# Patient Record
Sex: Male | Born: 1955
Health system: Southern US, Community
[De-identification: ages and names within clinical notes are randomized; demographics above are authoritative.]

## PROBLEM LIST (undated history)

## (undated) DIAGNOSIS — F329 Major depressive disorder, single episode, unspecified: Secondary | ICD-10-CM

## (undated) DIAGNOSIS — K219 Gastro-esophageal reflux disease without esophagitis: Secondary | ICD-10-CM

## (undated) DIAGNOSIS — E785 Hyperlipidemia, unspecified: Secondary | ICD-10-CM

## (undated) DIAGNOSIS — C801 Malignant (primary) neoplasm, unspecified: Secondary | ICD-10-CM

## (undated) DIAGNOSIS — F32A Depression, unspecified: Secondary | ICD-10-CM

## (undated) HISTORY — DX: Major depressive disorder, single episode, unspecified: F32.9

## (undated) HISTORY — PX: ORCHIECTOMY: SHX2116

## (undated) HISTORY — DX: Gastro-esophageal reflux disease without esophagitis: K21.9

## (undated) HISTORY — PX: COLONOSCOPY: SHX174

## (undated) HISTORY — DX: Malignant (primary) neoplasm, unspecified: C80.1

## (undated) HISTORY — PX: INGUINAL HERNIA REPAIR: SUR1180

## (undated) HISTORY — DX: Depression, unspecified: F32.A

## (undated) HISTORY — DX: Hyperlipidemia, unspecified: E78.5

---

## 1999-01-25 DIAGNOSIS — C801 Malignant (primary) neoplasm, unspecified: Secondary | ICD-10-CM

## 1999-01-25 HISTORY — DX: Malignant (primary) neoplasm, unspecified: C80.1

## 2004-10-06 ENCOUNTER — Ambulatory Visit: Payer: Self-pay | Admitting: Internal Medicine

## 2005-02-18 ENCOUNTER — Ambulatory Visit: Payer: Self-pay | Admitting: Internal Medicine

## 2005-03-14 ENCOUNTER — Ambulatory Visit: Payer: Self-pay | Admitting: Internal Medicine

## 2005-07-13 ENCOUNTER — Ambulatory Visit: Payer: Self-pay | Admitting: Internal Medicine

## 2005-07-25 ENCOUNTER — Ambulatory Visit: Payer: Self-pay | Admitting: Internal Medicine

## 2005-11-16 ENCOUNTER — Ambulatory Visit: Payer: Self-pay | Admitting: Internal Medicine

## 2005-11-16 LAB — CONVERTED CEMR LAB
ALT: 37 units/L (ref 0–40)
AST: 26 units/L (ref 0–37)
Albumin: 4.3 g/dL (ref 3.5–5.2)
BUN: 14 mg/dL (ref 6–23)
Basophils Relative: 0.3 % (ref 0.0–1.0)
Chol/HDL Ratio, serum: 5
Cholesterol: 141 mg/dL (ref 0–200)
Eosinophil percent: 2 % (ref 0.0–5.0)
GFR calc non Af Amer: 68 mL/min
HCT: 45.7 % (ref 39.0–52.0)
Hemoglobin: 14.9 g/dL (ref 13.0–17.0)
Leukocytes, UA: NEGATIVE
Neutro Abs: 3 10*3/uL (ref 1.4–7.7)
Neutrophils Relative %: 67.5 % (ref 43.0–77.0)
Nitrite: NEGATIVE
PSA: 0.95 ng/mL (ref 0.10–4.00)
Potassium: 4.7 meq/L (ref 3.5–5.1)
RBC: 5.32 M/uL (ref 4.22–5.81)
RDW: 12.7 % (ref 11.5–14.6)
Sodium: 139 meq/L (ref 135–145)
Specific Gravity, Urine: 1.02 (ref 1.000–1.03)
Total Bilirubin: 0.7 mg/dL (ref 0.3–1.2)
Total Protein, Urine: NEGATIVE mg/dL
Total Protein: 7.4 g/dL (ref 6.0–8.3)
Triglyceride fasting, serum: 132 mg/dL (ref 0–149)
Urine Glucose: NEGATIVE mg/dL
WBC: 4.5 10*3/uL (ref 4.5–10.5)

## 2005-11-22 ENCOUNTER — Ambulatory Visit: Payer: Self-pay | Admitting: Internal Medicine

## 2005-12-22 ENCOUNTER — Ambulatory Visit: Payer: Self-pay | Admitting: Gastroenterology

## 2006-01-03 ENCOUNTER — Ambulatory Visit: Payer: Self-pay | Admitting: Gastroenterology

## 2006-02-23 ENCOUNTER — Ambulatory Visit: Payer: Self-pay | Admitting: Internal Medicine

## 2006-09-27 ENCOUNTER — Ambulatory Visit: Payer: Self-pay | Admitting: Internal Medicine

## 2006-10-09 ENCOUNTER — Ambulatory Visit: Payer: Self-pay

## 2006-10-25 ENCOUNTER — Ambulatory Visit: Payer: Self-pay | Admitting: Internal Medicine

## 2006-10-25 LAB — CONVERTED CEMR LAB
ALT: 36 U/L
AST: 26 U/L
Albumin: 4.3 g/dL
Alkaline Phosphatase: 65 U/L
BUN: 22 mg/dL
Basophils Absolute: 0 K/uL
Basophils Relative: 0.2 %
Bilirubin, Direct: 0.2 mg/dL
CO2: 28 meq/L
Calcium: 9.8 mg/dL
Chloride: 110 meq/L
Cholesterol: 158 mg/dL
Creatinine, Ser: 1.2 mg/dL
Eosinophils Absolute: 0.1 K/uL
Eosinophils Relative: 2.3 %
GFR calc Af Amer: 81 mL/min
GFR calc non Af Amer: 67 mL/min
Glucose, Bld: 138 mg/dL — ABNORMAL HIGH
HCT: 42.5 %
HDL: 26.9 mg/dL — ABNORMAL LOW
Hemoglobin: 14.9 g/dL
LDL Cholesterol: 103 mg/dL — ABNORMAL HIGH
Lymphocytes Relative: 18.4 %
MCHC: 35 g/dL
MCV: 84.4 fL
Monocytes Absolute: 0.6 K/uL
Monocytes Relative: 14.2 % — ABNORMAL HIGH
Neutro Abs: 2.9 K/uL
Neutrophils Relative %: 64.9 %
Platelets: 157 K/uL
Potassium: 5.6 meq/L — ABNORMAL HIGH
RBC: 5.04 M/uL
RDW: 12.6 %
Sodium: 148 meq/L — ABNORMAL HIGH
TSH: 2.52 u[IU]/mL
Testosterone: 419.97 ng/dL
Total Bilirubin: 0.8 mg/dL
Total CHOL/HDL Ratio: 5.9
Total Protein: 7.2 g/dL
Triglycerides: 141 mg/dL
VLDL: 28 mg/dL
WBC: 4.4 10*3/microliter — ABNORMAL LOW

## 2006-10-30 ENCOUNTER — Encounter: Payer: Self-pay | Admitting: Internal Medicine

## 2006-10-30 DIAGNOSIS — F32A Depression, unspecified: Secondary | ICD-10-CM | POA: Insufficient documentation

## 2006-10-30 DIAGNOSIS — N529 Male erectile dysfunction, unspecified: Secondary | ICD-10-CM | POA: Insufficient documentation

## 2006-10-30 DIAGNOSIS — C6212 Malignant neoplasm of descended left testis: Secondary | ICD-10-CM | POA: Insufficient documentation

## 2006-10-30 DIAGNOSIS — F329 Major depressive disorder, single episode, unspecified: Secondary | ICD-10-CM

## 2006-10-30 DIAGNOSIS — E291 Testicular hypofunction: Secondary | ICD-10-CM | POA: Insufficient documentation

## 2006-10-30 DIAGNOSIS — K219 Gastro-esophageal reflux disease without esophagitis: Secondary | ICD-10-CM | POA: Insufficient documentation

## 2006-11-13 ENCOUNTER — Ambulatory Visit: Payer: Self-pay | Admitting: Internal Medicine

## 2006-11-13 LAB — CONVERTED CEMR LAB
BUN: 16 mg/dL (ref 6–23)
Calcium: 9.3 mg/dL (ref 8.4–10.5)
GFR calc Af Amer: 101 mL/min
GFR calc non Af Amer: 83 mL/min

## 2007-01-29 ENCOUNTER — Telehealth: Payer: Self-pay | Admitting: Internal Medicine

## 2007-02-16 ENCOUNTER — Encounter: Payer: Self-pay | Admitting: Internal Medicine

## 2007-04-10 ENCOUNTER — Telehealth: Payer: Self-pay | Admitting: Internal Medicine

## 2007-10-03 ENCOUNTER — Ambulatory Visit: Payer: Self-pay | Admitting: Internal Medicine

## 2007-10-03 LAB — CONVERTED CEMR LAB
Albumin: 4.2 g/dL (ref 3.5–5.2)
BUN: 19 mg/dL (ref 6–23)
Bilirubin Urine: NEGATIVE
Bilirubin, Direct: 0.1 mg/dL (ref 0.0–0.3)
Calcium: 9.2 mg/dL (ref 8.4–10.5)
Cholesterol: 135 mg/dL (ref 0–200)
GFR calc Af Amer: 100 mL/min
Glucose, Bld: 124 mg/dL — ABNORMAL HIGH (ref 70–99)
HCT: 43.4 % (ref 39.0–52.0)
HDL: 30.9 mg/dL — ABNORMAL LOW (ref >39.0)
Hemoglobin: 15 g/dL (ref 13.0–17.0)
Ketones, ur: NEGATIVE mg/dL
MCHC: 34.5 g/dL (ref 30.0–36.0)
Monocytes Absolute: 0.5 10*3/uL (ref 0.1–1.0)
Monocytes Relative: 13.3 % — ABNORMAL HIGH (ref 3.0–12.0)
Neutro Abs: 2.4 10*3/uL (ref 1.4–7.7)
Nitrite: NEGATIVE
PSA: 0.57 ng/mL (ref 0.10–4.00)
RDW: 12.8 % (ref 11.5–14.6)
Sodium: 140 meq/L (ref 135–145)
TSH: 2.52 microintl units/mL (ref 0.35–5.50)
Total Protein, Urine: NEGATIVE mg/dL
Total Protein: 7.3 g/dL (ref 6.0–8.3)
Triglycerides: 142 mg/dL (ref 0–149)
Urine Glucose: NEGATIVE mg/dL
pH: 7.5 (ref 5.0–8.0)

## 2007-10-10 ENCOUNTER — Ambulatory Visit: Payer: Self-pay | Admitting: Internal Medicine

## 2007-10-10 DIAGNOSIS — E785 Hyperlipidemia, unspecified: Secondary | ICD-10-CM | POA: Insufficient documentation

## 2007-10-16 ENCOUNTER — Telehealth: Payer: Self-pay | Admitting: Internal Medicine

## 2008-03-21 ENCOUNTER — Encounter: Payer: Self-pay | Admitting: Internal Medicine

## 2008-10-02 ENCOUNTER — Ambulatory Visit: Payer: Self-pay | Admitting: Internal Medicine

## 2008-10-02 LAB — CONVERTED CEMR LAB
ALT: 35 units/L (ref 0–53)
Albumin: 4.3 g/dL (ref 3.5–5.2)
Basophils Absolute: 0 10*3/uL (ref 0.0–0.1)
Calcium: 9.2 mg/dL (ref 8.4–10.5)
Cholesterol: 153 mg/dL (ref 0–200)
GFR calc non Af Amer: 73.8 mL/min (ref >60)
HCT: 43.4 % (ref 39.0–52.0)
HDL: 32.4 mg/dL — ABNORMAL LOW (ref >39.00)
Hemoglobin, Urine: NEGATIVE
Ketones, ur: NEGATIVE mg/dL
Leukocytes, UA: NEGATIVE
Lymphs Abs: 1 10*3/uL (ref 0.7–4.0)
MCV: 86.3 fL (ref 78.0–100.0)
Monocytes Absolute: 0.6 10*3/uL (ref 0.1–1.0)
Neutro Abs: 2.5 10*3/uL (ref 1.4–7.7)
PSA: 0.55 ng/mL (ref 0.10–4.00)
Platelets: 158 10*3/uL (ref 150.0–400.0)
RDW: 12.6 % (ref 11.5–14.6)
Sodium: 136 meq/L (ref 135–145)
Specific Gravity, Urine: 1.015 (ref 1.000–1.030)
TSH: 2.71 microintl units/mL (ref 0.35–5.50)
Total Bilirubin: 1.1 mg/dL (ref 0.3–1.2)
Triglycerides: 150 mg/dL — ABNORMAL HIGH (ref 0.0–149.0)
Urobilinogen, UA: 0.2 (ref 0.0–1.0)

## 2008-10-13 ENCOUNTER — Ambulatory Visit: Payer: Self-pay | Admitting: Internal Medicine

## 2008-10-13 DIAGNOSIS — R7309 Other abnormal glucose: Secondary | ICD-10-CM | POA: Insufficient documentation

## 2008-11-19 ENCOUNTER — Telehealth: Payer: Self-pay | Admitting: Internal Medicine

## 2008-11-26 ENCOUNTER — Telehealth: Payer: Self-pay | Admitting: Internal Medicine

## 2009-03-27 ENCOUNTER — Encounter: Payer: Self-pay | Admitting: Internal Medicine

## 2009-06-24 ENCOUNTER — Telehealth: Payer: Self-pay | Admitting: Internal Medicine

## 2009-06-29 ENCOUNTER — Encounter: Payer: Self-pay | Admitting: Internal Medicine

## 2009-07-10 ENCOUNTER — Telehealth: Payer: Self-pay | Admitting: Internal Medicine

## 2009-10-07 ENCOUNTER — Ambulatory Visit: Payer: Self-pay | Admitting: Internal Medicine

## 2009-10-07 LAB — CONVERTED CEMR LAB
ALT: 43 units/L (ref 0–53)
AST: 32 units/L (ref 0–37)
Alkaline Phosphatase: 58 units/L (ref 39–117)
BUN: 26 mg/dL — ABNORMAL HIGH (ref 6–23)
Basophils Absolute: 0 10*3/uL (ref 0.0–0.1)
Bilirubin Urine: NEGATIVE
Bilirubin, Direct: 0.1 mg/dL (ref 0.0–0.3)
Calcium: 9 mg/dL (ref 8.4–10.5)
Creatinine, Ser: 0.8 mg/dL (ref 0.4–1.5)
Eosinophils Relative: 1.8 % (ref 0.0–5.0)
GFR calc non Af Amer: 101.76 mL/min (ref >60)
HCT: 43.5 % (ref 39.0–52.0)
HDL: 26.9 mg/dL — ABNORMAL LOW (ref >39.00)
Hemoglobin, Urine: NEGATIVE
LDL Cholesterol: 90 mg/dL (ref 0–99)
Lymphocytes Relative: 20 % (ref 12.0–46.0)
Monocytes Relative: 10.9 % (ref 3.0–12.0)
Neutrophils Relative %: 67.1 % (ref 43.0–77.0)
Nitrite: NEGATIVE
PSA: 0.75 ng/mL (ref 0.10–4.00)
Platelets: 158 10*3/uL (ref 150.0–400.0)
Potassium: 4.5 meq/L (ref 3.5–5.1)
RDW: 14.2 % (ref 11.5–14.6)
Total Bilirubin: 0.4 mg/dL (ref 0.3–1.2)
Total Protein, Urine: NEGATIVE mg/dL
Urobilinogen, UA: 0.2 (ref 0.0–1.0)
VLDL: 31 mg/dL (ref 0.0–40.0)
WBC: 4.1 10*3/uL — ABNORMAL LOW (ref 4.5–10.5)

## 2009-10-14 ENCOUNTER — Ambulatory Visit: Payer: Self-pay | Admitting: Internal Medicine

## 2009-10-14 ENCOUNTER — Encounter (INDEPENDENT_AMBULATORY_CARE_PROVIDER_SITE_OTHER): Payer: Self-pay | Admitting: *Deleted

## 2009-10-14 ENCOUNTER — Telehealth: Payer: Self-pay | Admitting: Internal Medicine

## 2009-10-14 DIAGNOSIS — R197 Diarrhea, unspecified: Secondary | ICD-10-CM | POA: Insufficient documentation

## 2009-10-14 DIAGNOSIS — R142 Eructation: Secondary | ICD-10-CM

## 2009-10-14 DIAGNOSIS — R141 Gas pain: Secondary | ICD-10-CM | POA: Insufficient documentation

## 2009-10-14 DIAGNOSIS — R143 Flatulence: Secondary | ICD-10-CM

## 2009-10-28 ENCOUNTER — Telehealth: Payer: Self-pay | Admitting: Internal Medicine

## 2010-02-23 NOTE — Progress Notes (Signed)
Summary: Med frequency  Phone Note Call from Patient Call back at [phone]   Summary of Call: Pt was previously on advicor twice daily.  He was switched to 2 seperate meds but new meds are written for once daily. Should patient be on niaspan two times a day and lovastatin two times a day?  Initial call taken by: Lamar Sprinkles, CMA,  October 14, 2009 2:07 PM  Follow-up for Phone Call        Yes both two times a day - Sorry! Follow-up by: Tresa Garter MD,  October 14, 2009 5:46 PM  Additional Follow-up for Phone Call Additional follow up Details #1::        Pt informed  Additional Follow-up by: Lamar Sprinkles, CMA,  October 14, 2009 5:49 PM    New/Updated Medications: LOVASTATIN 20 MG TABS (LOVASTATIN) 1 by mouth bid for cholesterol NIASPAN 500 MG CR-TABS (NIACIN (ANTIHYPERLIPIDEMIC)) 1 by mouth bid Prescriptions: NIASPAN 500 MG CR-TABS (NIACIN (ANTIHYPERLIPIDEMIC)) 1 by mouth bid  #180 x 3   Entered by:   Lamar Sprinkles, CMA   Authorized by:   Tresa Garter MD   Signed by:   Lamar Sprinkles, CMA on 10/14/2009   Method used:   Faxed to ...       [facility] (mail-order)       [address]       [place], [place]  [address]       Ph: [phone]       Fax: [phone]   RxID:   [id number] LOVASTATIN 20 MG TABS (LOVASTATIN) 1 by mouth bid for cholesterol  #180 x 3   Entered by:   Lamar Sprinkles, CMA   Authorized by:   Tresa Garter MD   Signed by:   Lamar Sprinkles, CMA on 10/14/2009   Method used:   Faxed to ...       [address] [facility] (mail-order)       [address]       [place], [place]  [address]       Ph: [phone]       Fax: [phone]   RxID:   [id number]

## 2010-02-23 NOTE — Assessment & Plan Note (Signed)
Summary: PHYSICAL  STC   Vital Signs:  Patient profile:   55 year old male Height:      70 inches Weight:      178 pounds BMI:     25.63 Temp:     97.6 degrees F oral Pulse rate:   68 / minute Pulse rhythm:   regular Resp:     16 per minute BP sitting:   112 / 80  (left arm) Cuff size:   regular  Vitals Entered By: Lanier Prude, Beverly Gust) (October 14, 2009 11:14 AM) CC: CPX Is Patient Diabetic? No   CC:  CPX.  History of Present Illness: The patient presents for a preventive health examination  C/o diarrhea  at night once every 2 wks x 3 months   Preventive Screening-Counseling & Management  Alcohol-Tobacco     Smoking Status: never  Current Medications (verified): 1)  Wellbutrin Sr 150 Mg  Tb12 (Bupropion Hcl) .Marland Kitchen.. 1 Two Times A Day 2)  Omeprazole 20 Mg  Cpdr (Omeprazole) .Marland Kitchen.. 1 Two Times A Day 3)  Zoloft 100 Mg  Tabs (Sertraline Hcl) .Marland Kitchen.. 1 By Mouth Bid 4)  Ambien Cr 12.5 Mg  Tbcr (Zolpidem Tartrate) .Marland Kitchen.. 1 As Needed 5)  Acyclovir 400 Mg Tabs (Acyclovir) .Marland Kitchen.. 1 Two Times A Day X 5 Days 6)  Beconase Aq 42 Mcg/spray  Susp (Beclomethasone Diprop Monohyd) .... 2 Spr Each Nostr Once Daily To Two Times A Day As Needed 7)  Aspirin 81 Mg  Tbec (Aspirin) .... One By Mouth Every Day 8)  Vitamin D3 1000 Unit  Tabs (Cholecalciferol) .Marland Kitchen.. 1 By Mouth Daily 9)  Lovastatin 20 Mg Tabs (Lovastatin) .Marland Kitchen.. 1 By Mouth Once Daily For Cholesterol 10)  Niaspan 500 Mg Cr-Tabs (Niacin (Antihyperlipidemic)) .Marland Kitchen.. 1 By Mouth Once Daily At [facility]  Allergies (verified): 1)  ! Penicillin V Potassium (Penicillin V Potassium)  Past History:  Past Medical History: Last updated: 10/10/2007 Depression GERD H/o Tesic CA 2005 needs yearly CXR and abd/pelvic CT untill 2015 Hyperlipidemia  Family History: Last updated: 10/10/2007 MI, CVA parents  Social History: Last updated: 10/10/2007 Occupation: Married Never Smoked  Past Surgical History: Orchiectomy Colonoscopy  Review of  Systems  The patient denies anorexia, fever, weight loss, weight gain, vision loss, decreased hearing, hoarseness, chest pain, syncope, dyspnea on exertion, peripheral edema, prolonged cough, headaches, hemoptysis, abdominal pain, melena, hematochezia, severe indigestion/heartburn, hematuria, incontinence, genital sores, muscle weakness, suspicious skin lesions, transient blindness, difficulty walking, depression, unusual weight change, abnormal bleeding, enlarged lymph nodes, angioedema, and testicular masses.         diarrhea as above  Physical Exam  General:  Well-developed,well-nourished,in no acute distress; alert,appropriate and cooperative throughout examination Head:  Normocephalic and atraumatic without obvious abnormalities. No apparent alopecia or balding. Ears:  External ear exam shows no significant lesions or deformities.  Otoscopic examination reveals clear canals, tympanic membranes are intact bilaterally without bulging, retraction, inflammation or discharge. Hearing is grossly normal bilaterally. Nose:  External nasal examination shows no deformity or inflammation. Nasal mucosa are pink and moist without lesions or exudates. Mouth:  Oral mucosa and oropharynx without lesions or exudates.  Teeth in good repair. Neck:  No deformities, masses, or tenderness noted. Lungs:  Normal respiratory effort, chest expands symmetrically. Lungs are clear to auscultation, no crackles or wheezes. Heart:  Normal rate and regular rhythm. S1 and S2 normal without gallop, murmur, click, rub or other extra sounds. Abdomen:  Bowel sounds positive,abdomen soft and non-tender without masses, organomegaly or hernias  noted. Rectal:  No external abnormalities noted. Normal sphincter tone. No rectal masses or tenderness. G(-) Genitalia:  unchanged from before Prostate:  no gland enlargement and no nodules.   Msk:  No deformity or scoliosis noted of thoracic or lumbar spine.  Stiff LS Pulses:  R and L  carotid,radial,femoral,dorsalis pedis and posterior tibial pulses are full and equal bilaterally Extremities:  No clubbing, cyanosis, edema, or deformity noted with normal full range of motion of all joints.   Neurologic:  No cranial nerve deficits noted. Station and gait are normal. Plantar reflexes are down-going bilaterally. DTRs are symmetrical throughout. Sensory, motor and coordinative functions appear intact. Skin:  Intact without suspicious lesions or rashes Cervical Nodes:  No lymphadenopathy noted Inguinal Nodes:  No significant adenopathy Psych:  Oriented X3, normally interactive, and good eye contact.  not anxious appearing, not depressed appearing, and not suicidal.     Impression & Recommendations:  Problem # 1:  WELL ADULT EXAM (ICD-V70.0) Assessment New Health and age related issues were discussed. Available screening tests and vaccinations were discussed as well. Healthy life style including good diet and exercise was discussed.  The labs were reviewed with the patient.  Orders: EKG w/ Interpretation (93[address])  Problem # 2:  DIARRHEA, CHRONIC (ICD-787.91) Assessment: New  His updated medication list for this problem includes:    Align Caps (Probiotic product) .Marland Kitchen... 1 by mouth once daily for your intesinal flora restoraion  Orders: Gastroenterology Referral (GI)  Problem # 3:  FLATULENCE (ICD-787.3) Assessment: New  Orders: Gastroenterology Referral (GI)  Problem # 4:  HYPERLIPIDEMIA (ICD-272.4) Assessment: Unchanged  His updated medication list for this problem includes:    Lovastatin 20 Mg Tabs (Lovastatin) .Marland Kitchen... 1 by mouth bid for cholesterol    Niaspan 500 Mg Cr-tabs (Niacin (antihyperlipidemic)) .Marland Kitchen... 1 by mouth bid  Problem # 5:  HYPOGONADISM (ICD-257.2) Assessment: Comment Only  Problem # 6:  DEPRESSION (ICD-311) Assessment: Comment Only  His updated medication list for this problem includes:    Wellbutrin Sr 150 Mg Tb12 (Bupropion hcl) .Marland Kitchen... 1 two  times a day    Zoloft 100 Mg Tabs (Sertraline hcl) .Marland Kitchen... 1 by mouth bid  Complete Medication List: 1)  Wellbutrin Sr 150 Mg Tb12 (Bupropion hcl) .Marland Kitchen.. 1 two times a day 2)  Omeprazole 20 Mg Cpdr (Omeprazole) .Marland Kitchen.. 1 two times a day 3)  Zoloft 100 Mg Tabs (Sertraline hcl) .Marland Kitchen.. 1 by mouth bid 4)  Ambien Cr 12.5 Mg Tbcr (Zolpidem tartrate) .Marland Kitchen.. 1 as needed 5)  Acyclovir 400 Mg Tabs (Acyclovir) .Marland Kitchen.. 1 two times a day x 5 days 6)  Aspirin 81 Mg Tbec (Aspirin) .... One by mouth every day 7)  Vitamin D3 1000 Unit Tabs (Cholecalciferol) .Marland Kitchen.. 1 by mouth daily 8)  Lovastatin 20 Mg Tabs (Lovastatin) .Marland Kitchen.. 1 by mouth bid for cholesterol 9)  Niaspan 500 Mg Cr-tabs (Niacin (antihyperlipidemic)) .Marland Kitchen.. 1 by mouth bid 10)  Metronidazole 250 Mg Tabs (Metronidazole) .Marland Kitchen.. 1 by mouth qid 11)  Align Caps (Probiotic product) .Marland Kitchen.. 1 by mouth once daily for your intesinal flora restoraion 12)  Flonase 50 Mcg/act Susp (Fluticasone propionate) .Marland Kitchen.. 1 spr each nostr qd as needed  Other Orders: Admin 1st Vaccine ([address]) Flu Vaccine 36yrs + ([address])  Patient Instructions: 1)  Please schedule a follow-up appointment in 1 year. 2)  Go on Youtube ([email or web address].com) and look up "piriformis stretch", "Ileopsoas stretch", "IT band stretch" and "gluteus stretch". See the anatomy and learn the symptoms.  You can try to  self-diagnose. Do the stretches - it may help!  3)  Check CBG Prescriptions: NIASPAN 500 MG CR-TABS (NIACIN (ANTIHYPERLIPIDEMIC)) 1 by mouth once daily at hs  #90 x 3   Entered and Authorized by:   Tresa Garter MD   Signed by:   Tresa Garter MD on 10/14/2009   Method used:   Print then Give to Patient   RxID:   [id number] LOVASTATIN 20 MG TABS (LOVASTATIN) 1 by mouth once daily for cholesterol  #90 x 3   Entered and Authorized by:   Tresa Garter MD   Signed by:   Tresa Garter MD on 10/14/2009   Method used:   Print then Give to Patient   RxID:   [id number] FLONASE 50  MCG/ACT SUSP (FLUTICASONE PROPIONATE) 1 spr each nostr qd as needed  #3 x 3   Entered and Authorized by:   Tresa Garter MD   Signed by:   Tresa Garter MD on 10/14/2009   Method used:   Print then Give to Patient   RxID:   [id number] ACYCLOVIR 400 MG TABS (ACYCLOVIR) 1 two times a day x 5 days  #60 x 3   Entered and Authorized by:   Tresa Garter MD   Signed by:   Tresa Garter MD on 10/14/2009   Method used:   Print then Give to Patient   RxID:   [id number] AMBIEN CR 12.5 MG  TBCR (ZOLPIDEM TARTRATE) 1 as needed  #90 x 3   Entered and Authorized by:   Tresa Garter MD   Signed by:   Tresa Garter MD on 10/14/2009   Method used:   Print then Give to Patient   RxID:   [id number] ZOLOFT 100 MG  TABS (SERTRALINE HCL) 1 by mouth bid  #180 x 3   Entered and Authorized by:   Tresa Garter MD   Signed by:   Tresa Garter MD on 10/14/2009   Method used:   Print then Give to Patient   RxID:   [id number] OMEPRAZOLE 20 MG  CPDR (OMEPRAZOLE) 1 two times a day  #180 x 3   Entered and Authorized by:   Tresa Garter MD   Signed by:   Tresa Garter MD on 10/14/2009   Method used:   Print then Give to Patient   RxID:   [id number] WELLBUTRIN SR 150 MG  TB12 (BUPROPION HCL) 1 two times a day  #180 x 3   Entered and Authorized by:   Tresa Garter MD   Signed by:   Tresa Garter MD on 10/14/2009   Method used:   Print then Give to Patient   RxID:   [id number] ALIGN  CAPS (PROBIOTIC PRODUCT) 1 by mouth once daily for your intesinal flora restoraion  #30 x 1   Entered and Authorized by:   Tresa Garter MD   Signed by:   Tresa Garter MD on 10/14/2009   Method used:   Print then Give to Patient   RxID:   [id number] METRONIDAZOLE 250 MG TABS (METRONIDAZOLE) 1 by mouth qid  #40 x 1   Entered and Authorized by:   Tresa Garter MD   Signed by:   Tresa Garter MD on  10/14/2009   Method used:   Print then Give to Patient   RxID:   [id number]     Flu Vaccine Consent Questions     Do  you have a history of severe allergic reactions to this vaccine? no    Any prior history of allergic reactions to egg and/or gelatin? no    Do you have a sensitivity to the preservative Thimersol? no    Do you have a past history of Guillan-Barre Syndrome? no    Do you currently have an acute febrile illness? no    Have you ever had a severe reaction to latex? no    Vaccine information given and explained to patient? yes    Are you currently pregnant? no    Lot Number:[id number]   Exp Date:07/24/2010   Site Given  Left Deltoid IM Lanier Prude, [facility])  October 14, 2009 12:06 PM

## 2010-02-23 NOTE — Letter (Signed)
Summary: Daleen Squibb MD  Daleen Squibb MD   Imported By: Lester P[place] 04/22/2009 10:59:14  _____________________________________________________________________  External Attachment:    Type:   Image     Comment:   External Document

## 2010-02-23 NOTE — Medication Information (Signed)
Summary: Clarification for [facility]  Clarification for [facility]   Imported By: Sherian Rein 07/01/2009 09:16:41  _____________________________________________________________________  External Attachment:    Type:   Image     Comment:   External Document

## 2010-02-23 NOTE — Progress Notes (Signed)
Summary: Fluticasone ??  Phone Note Call from Patient   Caller: Patient Summary of Call: Pt having problem with a prescription, and wants to make a change. Please callback at [phone]. Initial call taken by: Alysia Penna,  October 28, 2009 1:36 PM  Follow-up for Phone Call        Pt is only rec 1 bottle of Fluticasone each month.  It needs to state 2 sprays in each nostril two times a day so they will dispense more at a time.  ok to resend a new Rx with above sig # 3? Follow-up by: Lanier Prude, [facility]),  October 29, 2009 12:12 PM  Additional Follow-up for Phone Call Additional follow up Details #1::        ok Thx Additional Follow-up by: Tresa Garter MD,  October 29, 2009 12:44 PM    New/Updated Medications: FLONASE 50 MCG/ACT SUSP (FLUTICASONE PROPIONATE) 2 sprays each nostril two times a day Prescriptions: FLONASE 50 MCG/ACT SUSP (FLUTICASONE PROPIONATE) 2 sprays each nostril two times a day  #3 x 3   Entered by:   Lamar Sprinkles, CMA   Authorized by:   Tresa Garter MD   Signed by:   Lamar Sprinkles, CMA on 10/29/2009   Method used:   Faxed to ...       [address] [facility] (mail-order)       [address]       [place], [place]  [address]       Ph: [phone]       Fax: [phone]   RxID:   [id number]

## 2010-02-23 NOTE — Progress Notes (Signed)
Summary: MED CHANGE  Phone Note From Pharmacy Call back at Work Phone [phone]   Summary of Call: [facility] hm delivery req to know if we informed pt of change from Advicor to [employer]. Pt was not aware of change. I spoke w/pt and he would like to go to Lovastatin and Niacin as 2 seperate rx's. Is this ok? If yes will send to mail order pharm.  Initial call taken by: Lamar Sprinkles, CMA,  July 10, 2009 10:59 AM  Follow-up for Phone Call        ok. Thx Follow-up by: Tresa Garter MD,  July 10, 2009 1:19 PM  Additional Follow-up for Phone Call Additional follow up Details #1::        Pt was previously on Advicor 500/20 two times a day. So will change to Niacin 500mg  and Lovastatin 20mg .   Should it be two times a day? for niacin? AND simvastatin? Or Lovastatin 40mg  1 once daily?  Please help, Thanks Additional Follow-up by: Lamar Sprinkles, CMA,  July 10, 2009 2:15 PM    Additional Follow-up for Phone Call Additional follow up Details #2::    Sorry - see meds Follow-up by: Tresa Garter MD,  July 13, 2009 7:30 AM  Additional Follow-up for Phone Call Additional follow up Details #3:: Details for Additional Follow-up Action Taken: Pt informed  Additional Follow-up by: Lamar Sprinkles, CMA,  July 13, 2009 8:35 AM  New/Updated Medications: LOVASTATIN 20 MG TABS (LOVASTATIN) 1 by mouth once daily for cholesterol NIASPAN 500 MG CR-TABS (NIACIN (ANTIHYPERLIPIDEMIC)) 1 by mouth once daily at hs Prescriptions: NIASPAN 500 MG CR-TABS (NIACIN (ANTIHYPERLIPIDEMIC)) 1 by mouth once daily at hs  #90 x 3   Entered by:   Lamar Sprinkles, CMA   Authorized by:   Tresa Garter MD   Signed by:   Lamar Sprinkles, CMA on 07/13/2009   Method used:   Faxed to ...       [facility] (mail-order)       [address]       [place], [place]  [address]       Ph: [phone]       Fax: [phone]   RxID:   [id number] LOVASTATIN 20 MG TABS (LOVASTATIN) 1 by mouth once daily for cholesterol  #90 x  3   Entered by:   Lamar Sprinkles, CMA   Authorized by:   Tresa Garter MD   Signed by:   Lamar Sprinkles, CMA on 07/13/2009   Method used:   Faxed to ...       [address] [facility] (mail-order)       [address]       [place], [place]  [address]       Ph: [phone]       Fax: [phone]   RxID:   [id number]

## 2010-02-23 NOTE — Progress Notes (Signed)
Summary: alternate med  Phone Note From Pharmacy   Summary of Call: [facility] home delivery sent fax asking if patient Advicor could be changed to [employer] due to cost. Please advise. Initial call taken by: Lucious Groves,  June 24, 2009 4:30 PM  Follow-up for Phone Call        ok Follow-up by: Tresa Garter MD,  June 24, 2009 6:06 PM    New/Updated Medications: SIMCOR 500-20 MG XR24H-TAB (NIACIN-SIMVASTATIN) 1 by mouth qd Prescriptions: SIMCOR 500-20 MG XR24H-TAB (NIACIN-SIMVASTATIN) 1 by mouth qd  #90 x 3   Entered by:   Lamar Sprinkles, CMA   Authorized by:   Tresa Garter MD   Signed by:   Lamar Sprinkles, CMA on 06/24/2009   Method used:   Faxed to ...       [address] [facility] (mail-order)       [address]       [place], [place]  [address]       Ph: [phone]       Fax: [phone]   RxID:   [id number]

## 2010-02-23 NOTE — Letter (Signed)
Summary: New Patient letter  [facility]  [address] [place], [place] [address]   Phone: [phone]  Fax: [phone]       10/14/2009 MRN: [id number]  Stephen Newman [address] [place], [place]  [address]  Dear Mr. [facility],  Welcome to the Gastroenterology Division at [facility].    You are scheduled to see Dr.  Melvia Heaps on December 02, 2009 at 10:00am on the 3rd floor at [facility], [address]  We ask that you try to arrive at our office 15 minutes prior to your appointment time to allow for check-in.  We would like you to complete the enclosed self-administered evaluation form prior to your visit and bring it with you on the day of your appointment.  We will review it with you.  Also, please bring a complete list of all your medications or, if you prefer, bring the medication bottles and we will list them.  Please bring your insurance card so that we may make a copy of it.  If your insurance requires a referral to see a specialist, please bring your referral form from your primary care physician.  Co-payments are due at the time of your visit and may be paid by cash, check or credit card.     Your office visit will consist of a consult with your physician (includes a physical exam), any laboratory testing he/she may order, scheduling of any necessary diagnostic testing (e.g. x-ray, ultrasound, CT-scan), and scheduling of a procedure (e.g. Endoscopy, Colonoscopy) if required.  Please allow enough time on your schedule to allow for any/all of these possibilities.    If you cannot keep your appointment, please call [phone] to cancel or reschedule prior to your appointment date.  This allows [place] the opportunity to schedule an appointment for another patient in need of care.  If you do not cancel or reschedule by 5 p.m. the business day prior to your appointment date, you will be charged a $50.00 late cancellation/no-show fee.     Thank you for choosing [facility] for your medical needs.  We appreciate the opportunity to care for you.  Please visit [place] at our website  to learn more about our practice.                     Sincerely,                                                             The Gastroenterology Division

## 2010-02-26 ENCOUNTER — Telehealth: Payer: Self-pay | Admitting: Internal Medicine

## 2010-03-03 NOTE — Progress Notes (Signed)
Summary: RF  Phone Note Call from Patient Call back at Work Phone [phone]   Summary of Call: Patient left vm req refill for rx - gave rx # but not name of med or pharmacy.  Initial call taken by: Lamar Sprinkles, CMA,  February 26, 2010 2:09 PM    Prescriptions: FLONASE 50 MCG/ACT SUSP (FLUTICASONE PROPIONATE) 2 sprays each nostril two times a day  #48month x 3   Entered by:   Rock Nephew CMA   Authorized by:   Tresa Garter MD   Signed by:   Rock Nephew CMA on 02/26/2010   Method used:   Faxed to ...       [address] [facility] (mail-order)       [address]       [place], [place]  [address]       Ph: [phone]       Fax: [phone]   RxID:   [id number]

## 2010-03-07 ENCOUNTER — Encounter: Payer: Self-pay | Admitting: Internal Medicine

## 2010-03-09 ENCOUNTER — Encounter: Payer: Self-pay | Admitting: Internal Medicine

## 2010-03-17 NOTE — Medication Information (Signed)
Summary: [facility]  Flonase / [facility]   Imported By: Lennie Odor 03/11/2010 16:31:11  _____________________________________________________________________  External Attachment:    Type:   Image     Comment:   External Document

## 2010-03-17 NOTE — Medication Information (Signed)
Summary: Flonase Nasal/[facility]  Flonase Nasal/[facility]   Imported By: Sherian Rein 03/11/2010 11:03:04  _____________________________________________________________________  External Attachment:    Type:   Image     Comment:   External Document

## 2010-03-23 ENCOUNTER — Telehealth: Payer: Self-pay | Admitting: Internal Medicine

## 2010-04-01 NOTE — Progress Notes (Signed)
  Phone Note Call from Patient Call back at Work Phone [phone]   Summary of Call: Patient is requesting rx from MD for cough med. OTC meds have not helped.  Initial call taken by: Lamar Sprinkles, CMA,  March 23, 2010 9:53 AM  Follow-up for Phone Call        Use over-the-counter medicines for "cold": Tylenol  650mg  or Advil 400mg  every 6 hours  for fever; Delsym or Robutussin for cough. Mucinex or Mucinex D for congestion. Ricola or Halls for sore throat. Office visit if not better or if worse.  OK Tessalon Follow-up by: Tresa Garter MD,  March 23, 2010 1:23 PM  Additional Follow-up for Phone Call Additional follow up Details #1::        Patient notified and rx  called in.Alvy Beal Archie CMA  March 23, 2010 2:25 PM     New/Updated Medications: TESSALON PERLES 100 MG CAPS (BENZONATATE) 1-2 by mouth two times a day as needed cogh Prescriptions: TESSALON PERLES 100 MG CAPS (BENZONATATE) 1-2 by mouth two times a day as needed cogh  #120 x 1   Entered by:   Rock Nephew CMA   Authorized by:   Tresa Garter MD   Signed by:   Rock Nephew CMA on 03/23/2010   Method used:   Telephoned to ...       [facility] [phone][facility] (retail)       [address]       [place], [place]  [address]       Ph: [phone]       Fax: [phone]   RxID:   [id number]

## 2010-04-22 ENCOUNTER — Telehealth: Payer: Self-pay | Admitting: *Deleted

## 2010-04-22 MED ORDER — FLUTICASONE PROPIONATE 50 MCG/ACT NA SUSP: NASAL | Status: DC

## 2010-04-22 NOTE — Telephone Encounter (Signed)
Patient requesting rx for flonase 4 sprays bid w/qty of w/rfs. OK?

## 2010-04-22 NOTE — Telephone Encounter (Signed)
2spr bid is too much - use 2 spr qd Ok to ref

## 2010-04-23 ENCOUNTER — Telehealth: Payer: Self-pay | Admitting: *Deleted

## 2010-04-23 MED ORDER — FLUTICASONE PROPIONATE 50 MCG/ACT NA SUSP: NASAL | Status: DC

## 2010-04-23 NOTE — Telephone Encounter (Signed)
rf sent to [facility]

## 2010-04-23 NOTE — Telephone Encounter (Signed)
Pts wife informed.

## 2010-04-26 ENCOUNTER — Telehealth: Payer: Self-pay | Admitting: *Deleted

## 2010-04-26 NOTE — Telephone Encounter (Signed)
rec fax req to change Acyclovir 400mg  to either 200mg  or Valacyclovir 500mg ...please advise new med, sig, quantity.  There is a form to be faxed back at [employer] [facility]

## 2010-04-28 MED ORDER — ACYCLOVIR 200 MG PO CAPS: 200.0000 mg | ORAL_CAPSULE | Freq: Two times a day (BID) | ORAL | Status: DC

## 2010-04-28 NOTE — Telephone Encounter (Signed)
ok 

## 2010-04-28 NOTE — Telephone Encounter (Signed)
Please advise which med/sig/quantity you recommend.

## 2010-04-28 NOTE — Telephone Encounter (Signed)
I did - it is in the chart. Thank you!

## 2010-04-29 NOTE — Telephone Encounter (Signed)
Closing note 

## 2010-06-08 NOTE — Assessment & Plan Note (Signed)
[facility]                           PRIMARY CARE OFFICE NOTE   NAME:Stephen Newman, Stephen Newman                  MRN:          [id number]  DATE:09/27/2006                            DOB:          [birth year]    The patient is a 55 year old male who presents for a wellness  examination.   ALLERGIES:  PENICILLIN.   Past medical history, family history and social history as per November 22, 2005, note.  His father had an MI at age 12.  [place]  He enjoys boating and  physical exercise.   Medicines reviewed with the patient.   REVIEW OF SYSTEMS:  He had a couple of episodes of chest pain and  shortness of breath with extreme exertion, 2-3 episodes in the past  month or so.  No syncope or sweats.  No GI problems.  The rest of the 18-  point review of systems is negative.   PHYSICAL:  Blood pressure 121/76, pulse 77, temperature 98.8, weight is  181 pounds.  Looks well.  HEENT:  With moist mucosa.  NECK:  Supple, no thyromegaly or bruit.  LUNGS:  Clear.  No wheeze or rales.  CARDIAC:  S1-S2, no murmur or gallop.  ABDOMEN:  Soft, no organomegaly or mass felt.  LOWER EXTREMITIES:  Without edema.  He is alert and appropriate.  He denies being depressed at present.  RECTAL:  Normal prostate.  Stool guaiac-negative.  No masses.  SKIN:  Clear.   LABS:  EKG with normal sinus rhythm.   ASSESSMENT AND PLAN:  1. Normal wellness examination.  Age/health related issues discussed.      Healthy lifestyle discussed.  Obtain lab work including      testosterone and PSA.  2. Left ganglion cyst on the wrist, dorsal, and a right ganglion cyst      on the wrist, ventral.  Orthopedic surgery consult with Dr. Merlyn Lot.  3. Exertional chest pain/shortness of breath, sounds fairly atypical.      EKG is normal.  Will obtain Cardiolite stress test.  In the      meantime he will refrain from extreme exertion, to ER if    problems.  4. History of herpes simplex, lip.  Zovirax ointment 5 times a day      p.r.n.     Georgina Quint. Plotnikov, MD  Electronically Signed    AVP/MedQ  DD: 09/27/2006  DT: 09/27/2006  Job #: [id number]   cc:   Cindee Salt, M.D.

## 2010-09-21 ENCOUNTER — Telehealth: Payer: Self-pay | Admitting: *Deleted

## 2010-09-21 DIAGNOSIS — Z0000 Encounter for general adult medical examination without abnormal findings: Secondary | ICD-10-CM

## 2010-09-21 DIAGNOSIS — Z0389 Encounter for observation for other suspected diseases and conditions ruled out: Secondary | ICD-10-CM

## 2010-09-21 NOTE — Telephone Encounter (Signed)
Message copied by Merrilyn Puma on Tue Sep 21, 2010 11:47 AM ------      Message from: Newell Coral      Created: Tue Sep 21, 2010  9:09 AM      Regarding: RE-sch labs for physical        Pt wants to do labs on Sept 13th for his physical.  He stated this was the only day he can come in.              Thanks so much for your help!

## 2010-10-07 ENCOUNTER — Other Ambulatory Visit (INDEPENDENT_AMBULATORY_CARE_PROVIDER_SITE_OTHER): Payer: Self-pay

## 2010-10-07 DIAGNOSIS — Z0389 Encounter for observation for other suspected diseases and conditions ruled out: Secondary | ICD-10-CM

## 2010-10-07 DIAGNOSIS — Z0000 Encounter for general adult medical examination without abnormal findings: Secondary | ICD-10-CM

## 2010-10-07 LAB — CBC WITH DIFFERENTIAL/PLATELET
Basophils Absolute: 0 10*3/uL (ref 0.0–0.1)
Basophils Relative: 0.4 % (ref 0.0–3.0)
Eosinophils Absolute: 0.1 10*3/uL (ref 0.0–0.7)
Lymphocytes Relative: 29.3 % (ref 12.0–46.0)
MCHC: 33.6 g/dL (ref 30.0–36.0)
MCV: 88 fl (ref 78.0–100.0)
Monocytes Absolute: 0.4 10*3/uL (ref 0.1–1.0)
Neutro Abs: 1.9 10*3/uL (ref 1.4–7.7)
Neutrophils Relative %: 55.3 % (ref 43.0–77.0)
RBC: 5.05 Mil/uL (ref 4.22–5.81)
RDW: 13.5 % (ref 11.5–14.6)

## 2010-10-07 LAB — HEPATIC FUNCTION PANEL
ALT: 40 U/L (ref 0–53)
AST: 30 U/L (ref 0–37)
Albumin: 4.1 g/dL (ref 3.5–5.2)
Alkaline Phosphatase: 69 U/L (ref 39–117)
Bilirubin, Direct: 0.1 mg/dL (ref 0.0–0.3)
Total Protein: 7 g/dL (ref 6.0–8.3)

## 2010-10-07 LAB — URINALYSIS, ROUTINE W REFLEX MICROSCOPIC
Hgb urine dipstick: NEGATIVE
Leukocytes, UA: NEGATIVE
Specific Gravity, Urine: 1.02 (ref 1.000–1.030)
Urine Glucose: NEGATIVE
Urobilinogen, UA: 1 (ref 0.0–1.0)

## 2010-10-07 LAB — BASIC METABOLIC PANEL
CO2: 30 mEq/L (ref 19–32)
Chloride: 103 mEq/L (ref 96–112)
Glucose, Bld: 126 mg/dL — ABNORMAL HIGH (ref 70–99)
Sodium: 141 mEq/L (ref 135–145)

## 2010-10-07 LAB — LIPID PANEL
Total CHOL/HDL Ratio: 4
Triglycerides: 97 mg/dL (ref 0.0–149.0)

## 2010-10-18 ENCOUNTER — Ambulatory Visit (INDEPENDENT_AMBULATORY_CARE_PROVIDER_SITE_OTHER): Payer: Managed Care, Other (non HMO) | Admitting: Internal Medicine

## 2010-10-18 ENCOUNTER — Encounter: Payer: Self-pay | Admitting: Internal Medicine

## 2010-10-18 VITALS — BP 108/64 | HR 72 | Temp 98.5°F | Resp 16 | Ht 71.0 in | Wt 174.0 lb

## 2010-10-18 DIAGNOSIS — F3289 Other specified depressive episodes: Secondary | ICD-10-CM

## 2010-10-18 DIAGNOSIS — Z136 Encounter for screening for cardiovascular disorders: Secondary | ICD-10-CM

## 2010-10-18 DIAGNOSIS — R7309 Other abnormal glucose: Secondary | ICD-10-CM

## 2010-10-18 DIAGNOSIS — F329 Major depressive disorder, single episode, unspecified: Secondary | ICD-10-CM

## 2010-10-18 DIAGNOSIS — C6290 Malignant neoplasm of unspecified testis, unspecified whether descended or undescended: Secondary | ICD-10-CM

## 2010-10-18 DIAGNOSIS — Z0000 Encounter for general adult medical examination without abnormal findings: Secondary | ICD-10-CM

## 2010-10-18 MED ORDER — SERTRALINE HCL 100 MG PO TABS: 100.0000 mg | ORAL_TABLET | Freq: Two times a day (BID) | ORAL | Status: DC

## 2010-10-18 MED ORDER — LOVASTATIN 20 MG PO TABS: 20.0000 mg | ORAL_TABLET | Freq: Every day | ORAL | Status: DC

## 2010-10-18 MED ORDER — FLUTICASONE PROPIONATE 50 MCG/ACT NA SUSP: 2.0000 | Freq: Two times a day (BID) | NASAL | Status: DC

## 2010-10-18 MED ORDER — BUPROPION HCL ER (SR) 150 MG PO TB12: 150.0000 mg | ORAL_TABLET | Freq: Two times a day (BID) | ORAL | Status: DC

## 2010-10-18 MED ORDER — OMEPRAZOLE 20 MG PO CPDR: 20.0000 mg | DELAYED_RELEASE_CAPSULE | Freq: Two times a day (BID) | ORAL | Status: DC

## 2010-10-18 MED ORDER — ACYCLOVIR 200 MG PO CAPS: 400.0000 mg | ORAL_CAPSULE | Freq: Two times a day (BID) | ORAL | Status: DC

## 2010-10-18 NOTE — Assessment & Plan Note (Signed)
We discussed age appropriate health related issues, including available/recomended screening tests and vaccinations. We discussed a need for adhering to healthy diet and exercise. Labs/EKG were reviewed/ordered. All questions were answered.   

## 2010-10-18 NOTE — Assessment & Plan Note (Signed)
Continue with current prescription therapy as reflected on the Med list.  

## 2010-10-18 NOTE — Assessment & Plan Note (Signed)
Cont monitoring

## 2010-10-18 NOTE — Progress Notes (Signed)
  Subjective:    Patient ID: Stephen Newman, male    DOB: 1955/05/26, 55 y.o.   MRN: [id number]  HPI  The patient is here for a wellness exam. The patient has been doing well overall without major physical or psychological issues going on lately.   Review of Systems  Constitutional: Negative for appetite change, fatigue and unexpected weight change.  HENT: Negative for nosebleeds, congestion, sore throat, sneezing, trouble swallowing and neck pain.   Eyes: Negative for itching and visual disturbance.  Respiratory: Negative for cough.   Cardiovascular: Negative for chest pain, palpitations and leg swelling.  Gastrointestinal: Negative for nausea, diarrhea, blood in stool and abdominal distention.  Genitourinary: Negative for frequency and hematuria.  Musculoskeletal: Negative for back pain, joint swelling and gait problem.  Skin: Negative for rash.  Neurological: Negative for dizziness, tremors, speech difficulty and weakness.  Psychiatric/Behavioral: Negative for suicidal ideas, sleep disturbance, dysphoric mood and agitation. The patient is not nervous/anxious.        Objective:   Physical Exam  Constitutional: He is oriented to person, place, and time. He appears well-developed.  HENT:  Mouth/Throat: Oropharynx is clear and moist.  Eyes: Conjunctivae are normal. Pupils are equal, round, and reactive to light.  Neck: Normal range of motion. No JVD present. No thyromegaly present.  Cardiovascular: Normal rate, regular rhythm, normal heart sounds and intact distal pulses.  Exam reveals no gallop and no friction rub.   No murmur heard. Pulmonary/Chest: Effort normal and breath sounds normal. No respiratory distress. He has no wheezes. He has no rales. He exhibits no tenderness.  Abdominal: Soft. Bowel sounds are normal. He exhibits no distension and no mass. There is no tenderness. There is no rebound and no guarding.  Genitourinary: Rectum normal, prostate normal and penis normal.  Guaiac negative stool. No penile tenderness.       L testes is absent  Musculoskeletal: Normal range of motion. He exhibits no edema and no tenderness.  Lymphadenopathy:    He has no cervical adenopathy.  Neurological: He is alert and oriented to person, place, and time. He has normal reflexes. No cranial nerve deficit. He exhibits normal muscle tone. Coordination normal.  Skin: Skin is warm and dry. No rash noted.  Psychiatric: He has a normal mood and affect. His behavior is normal. Judgment and thought content normal.    Lab Results  Component Value Date   WBC 3.5* 10/07/2010   HGB 14.9 10/07/2010   HCT 44.5 10/07/2010   PLT 158.0 10/07/2010   CHOL 130 10/07/2010   TRIG 97.0 10/07/2010   HDL 32.50* 10/07/2010   ALT 40 10/07/2010   AST 30 10/07/2010   NA 141 10/07/2010   K 4.3 10/07/2010   CL 103 10/07/2010   CREATININE 1.0 10/07/2010   BUN 22 10/07/2010   CO2 30 10/07/2010   TSH 2.20 10/07/2010   PSA 0.59 10/07/2010   HGBA1C 6.3 10/07/2009         Assessment & Plan:

## 2010-10-18 NOTE — Assessment & Plan Note (Signed)
Wt Readings from Last 3 Encounters:  10/18/10 174 lb (78.926 kg)  10/14/09 178 lb (80.74 kg)  10/13/08 177 lb (80.287 kg)  Eat well

## 2010-12-31 ENCOUNTER — Encounter: Payer: Self-pay | Admitting: Gastroenterology

## 2011-04-14 ENCOUNTER — Other Ambulatory Visit: Payer: Self-pay | Admitting: *Deleted

## 2011-04-14 MED ORDER — OMEPRAZOLE 20 MG PO CPDR: 20.0000 mg | DELAYED_RELEASE_CAPSULE | Freq: Two times a day (BID) | ORAL | Status: DC

## 2011-05-26 ENCOUNTER — Other Ambulatory Visit: Payer: Self-pay

## 2011-05-26 MED ORDER — LOVASTATIN 20 MG PO TABS: 20.0000 mg | ORAL_TABLET | Freq: Every day | ORAL | Status: DC

## 2011-05-26 MED ORDER — BUPROPION HCL ER (SR) 150 MG PO TB12: 150.0000 mg | ORAL_TABLET | Freq: Two times a day (BID) | ORAL | Status: DC

## 2011-05-26 MED ORDER — SERTRALINE HCL 100 MG PO TABS: 100.0000 mg | ORAL_TABLET | Freq: Two times a day (BID) | ORAL | Status: DC

## 2011-05-26 MED ORDER — FLUTICASONE PROPIONATE 50 MCG/ACT NA SUSP: 2.0000 | Freq: Two times a day (BID) | NASAL | Status: DC

## 2011-05-26 MED ORDER — OMEPRAZOLE 20 MG PO CPDR: 20.0000 mg | DELAYED_RELEASE_CAPSULE | Freq: Two times a day (BID) | ORAL | Status: DC

## 2011-06-16 ENCOUNTER — Other Ambulatory Visit: Payer: Self-pay

## 2011-06-16 MED ORDER — LOVASTATIN 20 MG PO TABS: 20.0000 mg | ORAL_TABLET | Freq: Two times a day (BID) | ORAL | Status: DC

## 2011-10-17 ENCOUNTER — Other Ambulatory Visit (INDEPENDENT_AMBULATORY_CARE_PROVIDER_SITE_OTHER): Payer: BC Managed Care – PPO

## 2011-10-17 ENCOUNTER — Other Ambulatory Visit: Payer: Self-pay | Admitting: *Deleted

## 2011-10-17 DIAGNOSIS — Z0000 Encounter for general adult medical examination without abnormal findings: Secondary | ICD-10-CM

## 2011-10-17 DIAGNOSIS — Z0389 Encounter for observation for other suspected diseases and conditions ruled out: Secondary | ICD-10-CM

## 2011-10-17 LAB — URINALYSIS, ROUTINE W REFLEX MICROSCOPIC
Bilirubin Urine: NEGATIVE
Ketones, ur: NEGATIVE
Specific Gravity, Urine: 1.025 (ref 1.000–1.030)
Urine Glucose: NEGATIVE
pH: 5.5 (ref 5.0–8.0)

## 2011-10-17 LAB — CBC WITH DIFFERENTIAL/PLATELET
Basophils Absolute: 0 10*3/uL (ref 0.0–0.1)
Basophils Relative: 0.5 % (ref 0.0–3.0)
Eosinophils Absolute: 0.1 10*3/uL (ref 0.0–0.7)
HCT: 43.4 % (ref 39.0–52.0)
Hemoglobin: 14.6 g/dL (ref 13.0–17.0)
Lymphs Abs: 0.8 10*3/uL (ref 0.7–4.0)
MCHC: 33.6 g/dL (ref 30.0–36.0)
Neutro Abs: 2.7 10*3/uL (ref 1.4–7.7)
RBC: 5.13 Mil/uL (ref 4.22–5.81)
RDW: 14.1 % (ref 11.5–14.6)

## 2011-10-18 LAB — LIPID PANEL
HDL: 28.5 mg/dL — ABNORMAL LOW (ref >39.00)
Total CHOL/HDL Ratio: 5
VLDL: 18.2 mg/dL (ref 0.0–40.0)

## 2011-10-18 LAB — HEPATIC FUNCTION PANEL
AST: 28 U/L (ref 0–37)
Albumin: 4.4 g/dL (ref 3.5–5.2)
Alkaline Phosphatase: 59 U/L (ref 39–117)
Bilirubin, Direct: 0.1 mg/dL (ref 0.0–0.3)

## 2011-10-18 LAB — BASIC METABOLIC PANEL
CO2: 27 mEq/L (ref 19–32)
Calcium: 9.2 mg/dL (ref 8.4–10.5)
GFR: 77.02 mL/min (ref >60.00)
Glucose, Bld: 98 mg/dL (ref 70–99)
Potassium: 4.5 mEq/L (ref 3.5–5.1)
Sodium: 139 mEq/L (ref 135–145)

## 2011-10-18 LAB — PSA: PSA: 0.7 ng/mL (ref 0.10–4.00)

## 2011-10-19 ENCOUNTER — Ambulatory Visit (INDEPENDENT_AMBULATORY_CARE_PROVIDER_SITE_OTHER): Payer: BC Managed Care – PPO | Admitting: Internal Medicine

## 2011-10-19 ENCOUNTER — Encounter: Payer: Self-pay | Admitting: Internal Medicine

## 2011-10-19 VITALS — BP 124/80 | HR 72 | Temp 99.5°F | Resp 16 | Ht 71.0 in | Wt 168.0 lb

## 2011-10-19 DIAGNOSIS — Z0000 Encounter for general adult medical examination without abnormal findings: Secondary | ICD-10-CM

## 2011-10-19 DIAGNOSIS — Z23 Encounter for immunization: Secondary | ICD-10-CM

## 2011-10-19 DIAGNOSIS — Z1211 Encounter for screening for malignant neoplasm of colon: Secondary | ICD-10-CM

## 2011-10-19 DIAGNOSIS — C6290 Malignant neoplasm of unspecified testis, unspecified whether descended or undescended: Secondary | ICD-10-CM

## 2011-10-19 DIAGNOSIS — F329 Major depressive disorder, single episode, unspecified: Secondary | ICD-10-CM

## 2011-10-19 DIAGNOSIS — E291 Testicular hypofunction: Secondary | ICD-10-CM

## 2011-10-19 DIAGNOSIS — E785 Hyperlipidemia, unspecified: Secondary | ICD-10-CM

## 2011-10-19 MED ORDER — ACYCLOVIR 200 MG PO CAPS: 400.0000 mg | ORAL_CAPSULE | Freq: Two times a day (BID) | ORAL | Status: DC

## 2011-10-19 MED ORDER — OMEPRAZOLE 20 MG PO CPDR: 20.0000 mg | DELAYED_RELEASE_CAPSULE | Freq: Two times a day (BID) | ORAL | Status: DC

## 2011-10-19 MED ORDER — BUPROPION HCL ER (SR) 150 MG PO TB12: 150.0000 mg | ORAL_TABLET | Freq: Two times a day (BID) | ORAL | Status: DC

## 2011-10-19 MED ORDER — LOVASTATIN 20 MG PO TABS: 20.0000 mg | ORAL_TABLET | Freq: Two times a day (BID) | ORAL | Status: DC

## 2011-10-19 MED ORDER — SERTRALINE HCL 100 MG PO TABS: 100.0000 mg | ORAL_TABLET | Freq: Two times a day (BID) | ORAL | Status: DC

## 2011-10-19 MED ORDER — FLUTICASONE PROPIONATE 50 MCG/ACT NA SUSP: 2.0000 | Freq: Two times a day (BID) | NASAL | Status: DC

## 2011-10-19 NOTE — Assessment & Plan Note (Signed)
Continue with current prescription therapy as reflected on the Med list.  

## 2011-10-19 NOTE — Assessment & Plan Note (Signed)
Annotation: [facility] 2003 Dr Penny Pia in [facility] Labs, CT, CXR q 12 mo - q 5 years since 2012

## 2011-10-19 NOTE — Assessment & Plan Note (Addendum)
We discussed age appropriate health related issues, including available/recomended screening tests and vaccinations. We discussed a need for adhering to healthy diet and exercise. Labs/EKG were reviewed/ordered. All questions were answered. Will sch colon

## 2011-10-19 NOTE — Progress Notes (Signed)
   Subjective:    Patient ID: Stephen Newman, male    DOB: 28-Jan-1955, 56 y.o.   MRN: [id number]  HPI  The patient is here for a wellness exam. The patient has been doing well overall without major physical or psychological issues going on lately.  BP Readings from Last 3 Encounters:  10/19/11 124/80  10/18/10 108/64  10/14/09 112/80   Wt Readings from Last 3 Encounters:  10/19/11 168 lb (76.204 kg)  10/18/10 174 lb (78.926 kg)  10/14/09 178 lb (80.74 kg)       Review of Systems  Constitutional: Negative for appetite change, fatigue and unexpected weight change.  HENT: Negative for nosebleeds, congestion, sore throat, sneezing, trouble swallowing and neck pain.   Eyes: Negative for itching and visual disturbance.  Respiratory: Negative for cough.   Cardiovascular: Negative for chest pain, palpitations and leg swelling.  Gastrointestinal: Negative for nausea, diarrhea, blood in stool and abdominal distention.  Genitourinary: Negative for frequency and hematuria.  Musculoskeletal: Negative for back pain, joint swelling and gait problem.  Skin: Negative for rash.  Neurological: Negative for dizziness, tremors, speech difficulty and weakness.  Psychiatric/Behavioral: Negative for suicidal ideas, disturbed wake/sleep cycle, dysphoric mood and agitation. The patient is not nervous/anxious.        Objective:   Physical Exam  Constitutional: He is oriented to person, place, and time. He appears well-developed.  HENT:  Mouth/Throat: Oropharynx is clear and moist.  Eyes: Conjunctivae normal are normal. Pupils are equal, round, and reactive to light.  Neck: Normal range of motion. No JVD present. No thyromegaly present.  Cardiovascular: Normal rate, regular rhythm, normal heart sounds and intact distal pulses.  Exam reveals no gallop and no friction rub.   No murmur heard. Pulmonary/Chest: Effort normal and breath sounds normal. No respiratory distress. He has no wheezes. He has  no rales. He exhibits no tenderness.  Abdominal: Soft. Bowel sounds are normal. He exhibits no distension and no mass. There is no tenderness. There is no rebound and no guarding.  Genitourinary: Rectum normal, prostate normal and penis normal. Guaiac negative stool. No penile tenderness.       L testes is absent  Musculoskeletal: Normal range of motion. He exhibits no edema and no tenderness.  Lymphadenopathy:    He has no cervical adenopathy.  Neurological: He is alert and oriented to person, place, and time. He has normal reflexes. No cranial nerve deficit. He exhibits normal muscle tone. Coordination normal.  Skin: Skin is warm and dry. No rash noted.  Psychiatric: He has a normal mood and affect. His behavior is normal. Judgment and thought content normal.    Lab Results  Component Value Date   WBC 4.1* 10/17/2011   HGB 14.6 10/17/2011   HCT 43.4 10/17/2011   PLT 150.0 10/17/2011   CHOL 153 10/17/2011   TRIG 91.0 10/17/2011   HDL 28.50* 10/17/2011   ALT 31 10/17/2011   AST 28 10/17/2011   NA 139 10/17/2011   K 4.5 10/17/2011   CL 102 10/17/2011   CREATININE 1.1 10/17/2011   BUN 28* 10/17/2011   CO2 27 10/17/2011   TSH 3.99 10/17/2011   PSA 0.70 10/17/2011   HGBA1C 6.3 10/07/2009         Assessment & Plan:

## 2012-01-05 ENCOUNTER — Other Ambulatory Visit: Payer: Self-pay | Admitting: *Deleted

## 2012-01-05 MED ORDER — LOVASTATIN 20 MG PO TABS: 20.0000 mg | ORAL_TABLET | Freq: Two times a day (BID) | ORAL | Status: DC

## 2012-04-23 ENCOUNTER — Other Ambulatory Visit: Payer: Self-pay | Admitting: *Deleted

## 2012-04-23 MED ORDER — FLUTICASONE PROPIONATE 50 MCG/ACT NA SUSP: 2.0000 | Freq: Two times a day (BID) | NASAL | Status: DC

## 2012-04-25 ENCOUNTER — Encounter: Payer: Self-pay | Admitting: Internal Medicine

## 2012-04-25 ENCOUNTER — Ambulatory Visit (INDEPENDENT_AMBULATORY_CARE_PROVIDER_SITE_OTHER): Payer: BC Managed Care – PPO | Admitting: Internal Medicine

## 2012-04-25 VITALS — BP 110/72 | HR 76 | Temp 98.8°F | Resp 16 | Wt 173.0 lb

## 2012-04-25 DIAGNOSIS — L84 Corns and callosities: Secondary | ICD-10-CM

## 2012-04-25 DIAGNOSIS — K219 Gastro-esophageal reflux disease without esophagitis: Secondary | ICD-10-CM

## 2012-04-25 DIAGNOSIS — B070 Plantar wart: Secondary | ICD-10-CM

## 2012-04-25 DIAGNOSIS — F329 Major depressive disorder, single episode, unspecified: Secondary | ICD-10-CM

## 2012-04-25 MED ORDER — BUPROPION HCL ER (SR) 150 MG PO TB12: 150.0000 mg | ORAL_TABLET | Freq: Two times a day (BID) | ORAL | Status: DC

## 2012-04-25 MED ORDER — OMEPRAZOLE 20 MG PO CPDR: 20.0000 mg | DELAYED_RELEASE_CAPSULE | Freq: Two times a day (BID) | ORAL | Status: DC

## 2012-04-25 MED ORDER — SERTRALINE HCL 100 MG PO TABS: 100.0000 mg | ORAL_TABLET | Freq: Two times a day (BID) | ORAL | Status: DC

## 2012-04-25 MED ORDER — ACYCLOVIR 200 MG PO CAPS: 400.0000 mg | ORAL_CAPSULE | Freq: Two times a day (BID) | ORAL | Status: DC

## 2012-04-25 MED ORDER — TRAMADOL HCL 50 MG PO TABS: 50.0000 mg | ORAL_TABLET | Freq: Two times a day (BID) | ORAL | Status: DC | PRN

## 2012-04-25 MED ORDER — FLUTICASONE PROPIONATE 50 MCG/ACT NA SUSP: 2.0000 | Freq: Two times a day (BID) | NASAL | Status: DC

## 2012-04-25 MED ORDER — LOVASTATIN 20 MG PO TABS: 20.0000 mg | ORAL_TABLET | Freq: Two times a day (BID) | ORAL | Status: DC

## 2012-04-25 NOTE — Assessment & Plan Note (Signed)
Cryo Tramadol or Ibuprofen for pain prn

## 2012-04-25 NOTE — Assessment & Plan Note (Signed)
Continue with current prescription therapy as reflected on the Med list.  

## 2012-04-25 NOTE — Patient Instructions (Signed)
   Postprocedure instructions :     Keep the wounds clean. You can wash them with liquid soap and water. Pat dry with gauze or a Kleenex tissue  Before applying antibiotic ointment and a Band-Aid.   You need to report immediately  if  any signs of infection develop.    

## 2012-04-25 NOTE — Progress Notes (Signed)
Patient ID: Stephen Newman, male   DOB: 1955-11-08, 57 y.o.   MRN: [id number]  C/o a wart and callus on R foot Needs meds refilled for GERD, depression  Procedure:    foot callus paring/cutting  Indication:    foot callus, painful - R 1st toe Consent: verbal  Risks and benefits were explained to the patient. Skin was cleaned with alcohol. I removed a large callus carefully with a round blade. Skin remained intact. Pain is better. Tolerated well. Complications: none. Bandaid applied     Procedure Note :     Procedure : Cryosurgery   Indication:  Wart(s)x1  R great toe - plantar wart   Risks including unsuccessful procedure , bleeding, infection, bruising, scar, a need for a repeat  procedure and others were explained to the patient in detail as well as the benefits. Informed consent was obtained verbally.   1  lesion(s)  on R great toe was/were treated with liquid nitrogen on a Q-tip in a usual fasion . Band-Aid was applied and antibiotic ointment was given for a later use.   Tolerated well. Complications none.

## 2012-04-25 NOTE — Assessment & Plan Note (Signed)
Will debride 

## 2012-05-22 ENCOUNTER — Encounter: Payer: Self-pay | Admitting: Internal Medicine

## 2012-05-22 ENCOUNTER — Ambulatory Visit (INDEPENDENT_AMBULATORY_CARE_PROVIDER_SITE_OTHER): Payer: BC Managed Care – PPO | Admitting: Internal Medicine

## 2012-05-22 VITALS — BP 120/70 | HR 61 | Temp 98.5°F

## 2012-05-22 DIAGNOSIS — B070 Plantar wart: Secondary | ICD-10-CM

## 2012-05-22 NOTE — Patient Instructions (Signed)
It was good to see you today. We have performed cryotherapy (liquid nitrogen freeze treatment) to the plantar warts on left foot today Return in 3-4 weeks for repeat treatment if needed Until then, okay to use over-the-counter salicylic acid wart treatment as discussed  Plantar Wart Warts are benign (noncancerous) growths of the outer skin layer. They can occur at any time in life but are most common during childhood and the teen years. Warts can occur on many skin surfaces of the body. When they occur on the underside (sole) of your foot they are called plantar warts. They often emerge in groups with several small warts encircling a larger growth. CAUSES  Human papillomavirus (HPV) is the cause of plantar warts. HPV attacks a break in the skin of the foot. Walking barefoot can lead to exposure to the wart virus. Plantar warts tend to develop over areas of pressure such as the heel and ball of the foot. Plantar warts often grow into the deeper layers of skin. They may spread to other areas of the sole but cannot spread to other areas of the body. SYMPTOMS  You may also notice a growth on the undersurface of your foot. The wart may grow directly into the sole of the foot, or rise above the surface of the skin on the sole of the foot, or both. They are most often flat from pressure. Warts generally do not cause itching but may cause pain in the area of the wart when you put weight on your foot. DIAGNOSIS  Diagnosis is made by physical examination. This means your caregiver discovers it while examining your foot.  TREATMENT  There are many ways to treat plantar warts. However, warts are very tough. Sometimes it is difficult to treat them so that they go away completely and do not grow back. Any treatment must be done regularly to work. If left untreated, most plantar warts will eventually disappear over a period of one to two years. Treatments you can do at home include:  Putting duct tape over the top  of the wart (occlusion), has been found to be effective over several months. The duct tape should be removed each night and reapplied until the wart has disappeared.  Placing over-the-counter medications on top of the wart to help kill the wart virus and remove the wart tissue (salicylic acid, cantharidin, and dichloroacetic acid ) are useful. These are called keratolytic agents. These medications make the skin soft and gradually layers will shed away. Theses compounds are usually placed on the wart each night and then covered with a band-aid. They are also available in pre-medicated band-aid form. Avoid surrounding skin when applying these liquids as these medications can burn healthy skin. The treatment may take several months of nightly use to be effective.  Cryotherapy to freeze the wart has recently become available over-the-counter for children 4 years and older. This system makes use of a soft narrow applicator connected to a bottle of compressed cold liquid that is applied directly to the wart. This medication can burn health skin and should be used with caution.  As with all over-the-counter medications, read the directions carefully before use. Treatments generally done in your caregiver's office include:  Some aggressive treatments may cause discomfort, discoloration and scaring of the surrounding skin. The risks and benefits of treatment should be discussed with your caregiver.  Freezing the wart with liquid nitrogen (cryotherapy, see above).  Burning the wart with use of very high heat (cautery).  Injecting medication  into the wart.  Surgically removing or laser treatment of the wart.  Your caregiver may refer you to a dermatologist for difficult to treat, large sized or large numbers of warts. HOME CARE INSTRUCTIONS   Soak the affected area in warm water. Dry the area completely when you are done. Remove the top layer of softened skin, then apply the chosen topical medication and  reapply a bandage.  Remove the bandage daily and file excess wart tissue (pumice stone works well for this purpose). Repeat the entire process daily or every other day for weeks until the plantar wart disappears.  Several brands of salicylic acid pads are available as over-the-counter remedies.  Pain can be relieved by wearing a doughnut bandage. This is a bandage with a hole in it. The bandage is put on with the hole over the wart. This helps take the pressure off the wart and gives pain relief. To help prevent plantar warts:  Wear shoes and socks and change them daily.  Keep feet clean and dry.  Check your feet and your children's feet regularly.  Avoid direct contact with warts on other people.  Have growths, or changes on your skin checked by your caregiver. Document Released: 04/02/2003 Document Revised: 04/04/2011 Document Reviewed: 09/10/2008 [facility] Patient Information 2013 [place], [place].

## 2012-05-22 NOTE — Progress Notes (Signed)
  Subjective:    Patient ID: Stephen Newman, male    DOB: Apr 23, 1955, 57 y.o.   MRN: [id number]  HPI  Here for followup wart - treatment of plantar wart with cryo-3 weeks ago  Past Medical History  Diagnosis Date  . Depression   . GERD (gastroesophageal reflux disease)   . Hyperlipidemia   . Cancer     testicular    Review of Systems  Musculoskeletal: Negative for back pain and gait problem.  Skin: Negative for rash and wound.       Objective:   Physical Exam BP 120/70  Pulse 61  Temp(Src) 98.5 F (36.9 C) (Oral)  SpO2 98% Wt Readings from Last 3 Encounters:  04/25/12 173 lb (78.472 kg)  10/19/11 168 lb (76.204 kg)  10/18/10 174 lb (78.926 kg)   Constitutional: he appears well-developed and well-nourished. No distress.  Skin: L great toe with 5mm plantar wart - remaining skin is warm and dry. No rash noted. No erythema.  Psychiatric: he has a normal mood and affect. behavior is normal. Judgment and thought content normal.   Procedure: Liquid nitrogen cryotherapy, wart destruction Indication: Wart(s) After obtaining verbal informed consent, including review of procedure and anticipated side effects of blistering and pain, patient agrees to proceed. Liquid nitrogen cryotherapy is applied to wart for 15 second freeze time. 15 second thaw time allowed, then repeat cryotherapy treatment for 15 seconds to same lesion. Patient tolerated the procedure well, no immediate complications. Aftercare instructions provided.       Assessment & Plan:   Plantar wart, left great toe cryo treatment today as above Continue over-the-counter salicylic acid treatments between now and 3 week visit to repeat cryo if needed Education provided

## 2012-11-05 ENCOUNTER — Encounter: Payer: Self-pay | Admitting: Internal Medicine

## 2012-11-05 ENCOUNTER — Ambulatory Visit (INDEPENDENT_AMBULATORY_CARE_PROVIDER_SITE_OTHER): Payer: BC Managed Care – PPO | Admitting: Internal Medicine

## 2012-11-05 ENCOUNTER — Other Ambulatory Visit (INDEPENDENT_AMBULATORY_CARE_PROVIDER_SITE_OTHER): Payer: BC Managed Care – PPO

## 2012-11-05 VITALS — BP 120/84 | HR 72 | Temp 99.3°F | Resp 16 | Ht 70.0 in | Wt 169.0 lb

## 2012-11-05 DIAGNOSIS — K219 Gastro-esophageal reflux disease without esophagitis: Secondary | ICD-10-CM

## 2012-11-05 DIAGNOSIS — Z0000 Encounter for general adult medical examination without abnormal findings: Secondary | ICD-10-CM

## 2012-11-05 DIAGNOSIS — F329 Major depressive disorder, single episode, unspecified: Secondary | ICD-10-CM

## 2012-11-05 DIAGNOSIS — C6290 Malignant neoplasm of unspecified testis, unspecified whether descended or undescended: Secondary | ICD-10-CM

## 2012-11-05 DIAGNOSIS — E291 Testicular hypofunction: Secondary | ICD-10-CM

## 2012-11-05 DIAGNOSIS — F3289 Other specified depressive episodes: Secondary | ICD-10-CM

## 2012-11-05 DIAGNOSIS — Z23 Encounter for immunization: Secondary | ICD-10-CM

## 2012-11-05 LAB — TSH: TSH: 3.69 u[IU]/mL (ref 0.35–5.50)

## 2012-11-05 LAB — BASIC METABOLIC PANEL
CO2: 29 mEq/L (ref 19–32)
Calcium: 8.8 mg/dL (ref 8.4–10.5)
Chloride: 105 mEq/L (ref 96–112)
Glucose, Bld: 114 mg/dL — ABNORMAL HIGH (ref 70–99)
Potassium: 4.5 mEq/L (ref 3.5–5.1)
Sodium: 142 mEq/L (ref 135–145)

## 2012-11-05 LAB — CBC WITH DIFFERENTIAL/PLATELET
Basophils Absolute: 0 10*3/uL (ref 0.0–0.1)
Basophils Relative: 0.3 % (ref 0.0–3.0)
Eosinophils Absolute: 0.1 10*3/uL (ref 0.0–0.7)
HCT: 40.3 % (ref 39.0–52.0)
Hemoglobin: 13.7 g/dL (ref 13.0–17.0)
Lymphocytes Relative: 17.5 % (ref 12.0–46.0)
Lymphs Abs: 0.9 10*3/uL (ref 0.7–4.0)
MCHC: 34 g/dL (ref 30.0–36.0)
Neutro Abs: 3.8 10*3/uL (ref 1.4–7.7)
Platelets: 144 10*3/uL — ABNORMAL LOW (ref 150.0–400.0)
RBC: 4.88 Mil/uL (ref 4.22–5.81)
RDW: 13.6 % (ref 11.5–14.6)

## 2012-11-05 LAB — URINALYSIS
Bilirubin Urine: NEGATIVE
Hgb urine dipstick: NEGATIVE
Leukocytes, UA: NEGATIVE
Nitrite: NEGATIVE
Specific Gravity, Urine: 1.025 (ref 1.000–1.030)
Total Protein, Urine: NEGATIVE
Urobilinogen, UA: 1 (ref 0.0–1.0)

## 2012-11-05 LAB — HEPATIC FUNCTION PANEL
Albumin: 4.1 g/dL (ref 3.5–5.2)
Total Bilirubin: 0.5 mg/dL (ref 0.3–1.2)

## 2012-11-05 MED ORDER — LOVASTATIN 20 MG PO TABS: 20.0000 mg | ORAL_TABLET | Freq: Two times a day (BID) | ORAL | Status: DC

## 2012-11-05 MED ORDER — SERTRALINE HCL 100 MG PO TABS: 100.0000 mg | ORAL_TABLET | Freq: Two times a day (BID) | ORAL | Status: DC

## 2012-11-05 MED ORDER — OMEPRAZOLE 20 MG PO CPDR: 20.0000 mg | DELAYED_RELEASE_CAPSULE | Freq: Two times a day (BID) | ORAL | Status: DC

## 2012-11-05 MED ORDER — FLUTICASONE PROPIONATE 50 MCG/ACT NA SUSP: 2.0000 | Freq: Two times a day (BID) | NASAL | Status: DC

## 2012-11-05 MED ORDER — BUPROPION HCL ER (SR) 150 MG PO TB12: 150.0000 mg | ORAL_TABLET | Freq: Two times a day (BID) | ORAL | Status: DC

## 2012-11-05 NOTE — Assessment & Plan Note (Signed)
We discussed age appropriate health related issues, including available/recomended screening tests and vaccinations. We discussed a need for adhering to healthy diet and exercise. Labs/EKG were reviewed/ordered. All questions were answered.   

## 2012-11-05 NOTE — Progress Notes (Signed)
   Subjective:   HPI  The patient is here for a wellness exam. The patient has been doing well overall without major physical or psychological issues going on lately. C/o R testes pain at times  BP Readings from Last 3 Encounters:  11/05/12 120/84  05/22/12 120/70  04/25/12 110/72   Wt Readings from Last 3 Encounters:  11/05/12 169 lb (76.658 kg)  04/25/12 173 lb (78.472 kg)  10/19/11 168 lb (76.204 kg)       Review of Systems  Constitutional: Negative for appetite change, fatigue and unexpected weight change.  HENT: Negative for congestion, nosebleeds, sneezing, sore throat and trouble swallowing.   Eyes: Negative for itching and visual disturbance.  Respiratory: Negative for cough.   Cardiovascular: Negative for chest pain, palpitations and leg swelling.  Gastrointestinal: Negative for nausea, diarrhea, blood in stool and abdominal distention.  Genitourinary: Negative for frequency and hematuria.  Musculoskeletal: Negative for back pain, gait problem, joint swelling and neck pain.  Skin: Negative for rash.  Neurological: Negative for dizziness, tremors, speech difficulty and weakness.  Psychiatric/Behavioral: Negative for suicidal ideas, sleep disturbance, dysphoric mood and agitation. The patient is not nervous/anxious.        Objective:   Physical Exam  Constitutional: He is oriented to person, place, and time. He appears well-developed.  HENT:  Mouth/Throat: Oropharynx is clear and moist.  Eyes: Conjunctivae are normal. Pupils are equal, round, and reactive to light.  Neck: Normal range of motion. No JVD present. No thyromegaly present.  Cardiovascular: Normal rate, regular rhythm, normal heart sounds and intact distal pulses.  Exam reveals no gallop and no friction rub.   No murmur heard. Pulmonary/Chest: Effort normal and breath sounds normal. No respiratory distress. He has no wheezes. He has no rales. He exhibits no tenderness.  Abdominal: Soft. Bowel sounds are  normal. He exhibits no distension and no mass. There is no tenderness. There is no rebound and no guarding.  Genitourinary: Rectum normal, prostate normal and penis normal. Guaiac negative stool. No penile tenderness.  L testes is absent  Musculoskeletal: Normal range of motion. He exhibits no edema and no tenderness.  Lymphadenopathy:    He has no cervical adenopathy.  Neurological: He is alert and oriented to person, place, and time. He has normal reflexes. No cranial nerve deficit. He exhibits normal muscle tone. Coordination normal.  Skin: Skin is warm and dry. No rash noted.  Psychiatric: He has a normal mood and affect. His behavior is normal. Judgment and thought content normal.    Lab Results  Component Value Date   WBC 4.1* 10/17/2011   HGB 14.6 10/17/2011   HCT 43.4 10/17/2011   PLT 150.0 10/17/2011   CHOL 153 10/17/2011   TRIG 91.0 10/17/2011   HDL 28.50* 10/17/2011   ALT 31 10/17/2011   AST 28 10/17/2011   NA 139 10/17/2011   K 4.5 10/17/2011   CL 102 10/17/2011   CREATININE 1.1 10/17/2011   BUN 28* 10/17/2011   CO2 27 10/17/2011   TSH 3.99 10/17/2011   PSA 0.70 10/17/2011   HGBA1C 6.3 10/07/2009         Assessment & Plan:

## 2012-11-05 NOTE — Assessment & Plan Note (Signed)
Continue with current prescription therapy as reflected on the Med list.  

## 2012-11-05 NOTE — Assessment & Plan Note (Signed)
See instructions

## 2012-11-05 NOTE — Assessment & Plan Note (Addendum)
Doing well. Labs/tests - prn (Annotation: [place] 2003 L Dr Penny Pia in [facility] Labs, CT, CXR q 12 mo - q 5 years since 2012)

## 2012-11-05 NOTE — Patient Instructions (Signed)
Support athletic briefs

## 2013-07-24 ENCOUNTER — Ambulatory Visit (INDEPENDENT_AMBULATORY_CARE_PROVIDER_SITE_OTHER): Payer: BC Managed Care – PPO | Admitting: Internal Medicine

## 2013-07-24 ENCOUNTER — Encounter: Payer: Self-pay | Admitting: Internal Medicine

## 2013-07-24 VITALS — BP 120/80 | HR 63 | Temp 98.6°F | Wt 172.8 lb

## 2013-07-24 DIAGNOSIS — K1370 Unspecified lesions of oral mucosa: Secondary | ICD-10-CM

## 2013-07-24 MED ORDER — DOXYCYCLINE HYCLATE 100 MG PO TABS: ORAL_TABLET | ORAL | Status: DC

## 2013-07-24 NOTE — Progress Notes (Signed)
   Subjective:    Patient ID: Stephen Newman, male    DOB: 09/09/1955, 58 y.o.   MRN: [id number]  HPI   He's had a mucosal lesion of the right lower lip for 5X6 weeks. He's had recurrent "canker sores" which have responded to acyclovir in the past. There's been no change despite having taken this for almost 20 days.  He also has a history of fever blisters.  These seem to be related to stress. Also he questions whether he traumatizes mucosa due to an overbite.   Review of Systems   He denies fever, chills,  Sweats, or weight loss.  He has no significant arthralgias and no rashes.  He has no symptoms of rhinosinusitis such as frontal headache, facial pain, nasal purulence, sore throat, otic pain, otic discharge.         Objective:   Physical Exam   He appears to have a small hematoma in the right lower lip mucosa. I do not appreciate definite ulceration.  The remainder the exam is unremarkable.  General appearance is one of good health and nourishment w/o distress.  Eyes: No conjunctival inflammation or scleral icterus is present.  Oral exam: Dental hygiene is good; lips and gums are healthy appearing otherwise.There is no oropharyngeal erythema or exudate noted.   Heart:  Normal rate and regular rhythm. S1 and S2 normal without gallop, murmur, click, rub or other extra sounds     Lungs:Chest clear to auscultation; no wheezes, rhonchi,rales ,or rubs present.No increased work of breathing.   Abdomen: bowel sounds normal, soft and non-tender without masses, organomegaly or hernias noted.  No guarding or rebound . No tenderness over the flanks to percussion  Musculoskeletal: Able to lie flat and sit up without help. Negative straight leg raising bilaterally.No joint changes. Gait normal  Skin:Warm & dry.  Intact without suspicious lesions or rashes ; no jaundice or tenting  Lymphatic: No lymphadenopathy is noted about the head, neck, axilla              Assessment & Plan:  #1 mucosal lesion, hematoma suggested. No findings to suggest Bechet's. See orders & AVS

## 2013-07-24 NOTE — Patient Instructions (Addendum)
I recommend an  ENT consultation to determine optimal therapy if no better with Doxycycline as ordered.

## 2013-07-24 NOTE — Progress Notes (Signed)
Pre visit review using our clinic review tool, if applicable. No additional management support is needed unless otherwise documented below in the visit note. 

## 2013-10-01 ENCOUNTER — Other Ambulatory Visit: Payer: Self-pay | Admitting: Internal Medicine

## 2013-10-21 ENCOUNTER — Encounter: Payer: Self-pay | Admitting: Gastroenterology

## 2013-11-04 ENCOUNTER — Other Ambulatory Visit: Payer: Self-pay | Admitting: Internal Medicine

## 2013-11-06 ENCOUNTER — Other Ambulatory Visit (INDEPENDENT_AMBULATORY_CARE_PROVIDER_SITE_OTHER): Payer: BC Managed Care – PPO

## 2013-11-06 ENCOUNTER — Encounter: Payer: Self-pay | Admitting: Internal Medicine

## 2013-11-06 ENCOUNTER — Ambulatory Visit (INDEPENDENT_AMBULATORY_CARE_PROVIDER_SITE_OTHER): Payer: BC Managed Care – PPO | Admitting: Internal Medicine

## 2013-11-06 VITALS — BP 120/80 | Temp 98.5°F | Ht 70.0 in | Wt 167.0 lb

## 2013-11-06 DIAGNOSIS — C6212 Malignant neoplasm of descended left testis: Secondary | ICD-10-CM

## 2013-11-06 DIAGNOSIS — N529 Male erectile dysfunction, unspecified: Secondary | ICD-10-CM

## 2013-11-06 DIAGNOSIS — Z23 Encounter for immunization: Secondary | ICD-10-CM

## 2013-11-06 DIAGNOSIS — Z0000 Encounter for general adult medical examination without abnormal findings: Secondary | ICD-10-CM

## 2013-11-06 DIAGNOSIS — N528 Other male erectile dysfunction: Secondary | ICD-10-CM

## 2013-11-06 LAB — BASIC METABOLIC PANEL
BUN: 21 mg/dL (ref 6–23)
CHLORIDE: 102 meq/L (ref 96–112)
CO2: 31 meq/L (ref 19–32)
CREATININE: 1 mg/dL (ref 0.4–1.5)
Calcium: 9.5 mg/dL (ref 8.4–10.5)
GFR: 81.86 mL/min (ref >60.00)
Glucose, Bld: 117 mg/dL — ABNORMAL HIGH (ref 70–99)
Potassium: 4.8 mEq/L (ref 3.5–5.1)
Sodium: 138 mEq/L (ref 135–145)

## 2013-11-06 LAB — LIPID PANEL
CHOL/HDL RATIO: 7
Cholesterol: 170 mg/dL (ref 0–200)
HDL: 22.8 mg/dL — ABNORMAL LOW (ref >39.00)
LDL Cholesterol: 109 mg/dL — ABNORMAL HIGH (ref 0–99)
NonHDL: 147.2
Triglycerides: 190 mg/dL — ABNORMAL HIGH (ref 0.0–149.0)
VLDL: 38 mg/dL (ref 0.0–40.0)

## 2013-11-06 LAB — URINALYSIS
Bilirubin Urine: NEGATIVE
HGB URINE DIPSTICK: NEGATIVE
Ketones, ur: NEGATIVE
Leukocytes, UA: NEGATIVE
Nitrite: NEGATIVE
PH: 6.5 (ref 5.0–8.0)
Specific Gravity, Urine: 1.02 (ref 1.000–1.030)
TOTAL PROTEIN, URINE-UPE24: NEGATIVE
URINE GLUCOSE: NEGATIVE
Urobilinogen, UA: 0.2 (ref 0.0–1.0)

## 2013-11-06 LAB — CBC WITH DIFFERENTIAL/PLATELET
BASOS PCT: 0.1 % (ref 0.0–3.0)
Basophils Absolute: 0 10*3/uL (ref 0.0–0.1)
EOS ABS: 0.1 10*3/uL (ref 0.0–0.7)
EOS PCT: 2.4 % (ref 0.0–5.0)
HEMATOCRIT: 45.1 % (ref 39.0–52.0)
HEMOGLOBIN: 14.8 g/dL (ref 13.0–17.0)
LYMPHS ABS: 1 10*3/uL (ref 0.7–4.0)
Lymphocytes Relative: 19.5 % (ref 12.0–46.0)
MCHC: 32.9 g/dL (ref 30.0–36.0)
MCV: 84.7 fl (ref 78.0–100.0)
Monocytes Absolute: 0.5 10*3/uL (ref 0.1–1.0)
Monocytes Relative: 10.9 % (ref 3.0–12.0)
NEUTROS ABS: 3.3 10*3/uL (ref 1.4–7.7)
NEUTROS PCT: 67.1 % (ref 43.0–77.0)
Platelets: 172 10*3/uL (ref 150.0–400.0)
RBC: 5.32 Mil/uL (ref 4.22–5.81)
RDW: 13.5 % (ref 11.5–15.5)
WBC: 5 10*3/uL (ref 4.0–10.5)

## 2013-11-06 LAB — HEPATIC FUNCTION PANEL
ALT: 45 U/L (ref 0–53)
AST: 32 U/L (ref 0–37)
Albumin: 3.9 g/dL (ref 3.5–5.2)
Alkaline Phosphatase: 64 U/L (ref 39–117)
Bilirubin, Direct: 0.1 mg/dL (ref 0.0–0.3)
TOTAL PROTEIN: 7.6 g/dL (ref 6.0–8.3)
Total Bilirubin: 0.7 mg/dL (ref 0.2–1.2)

## 2013-11-06 LAB — HCG, QUANTITATIVE, PREGNANCY: QUANTITATIVE HCG: 1.87 m[IU]/mL

## 2013-11-06 LAB — PSA: PSA: 1.23 ng/mL (ref 0.10–4.00)

## 2013-11-06 LAB — TSH: TSH: 3.86 u[IU]/mL (ref 0.35–4.50)

## 2013-11-06 MED ORDER — FLUTICASONE PROPIONATE 50 MCG/ACT NA SUSP: NASAL | Status: DC

## 2013-11-06 MED ORDER — LOVASTATIN 20 MG PO TABS
20.0000 mg | ORAL_TABLET | Freq: Two times a day (BID) | ORAL | Status: DC
Start: 2013-11-06 — End: 2014-04-29

## 2013-11-06 MED ORDER — BUPROPION HCL ER (SR) 150 MG PO TB12: 150.0000 mg | ORAL_TABLET | Freq: Two times a day (BID) | ORAL | Status: DC

## 2013-11-06 MED ORDER — OMEPRAZOLE 20 MG PO CPDR: 20.0000 mg | DELAYED_RELEASE_CAPSULE | Freq: Two times a day (BID) | ORAL | Status: DC

## 2013-11-06 MED ORDER — SERTRALINE HCL 100 MG PO TABS: 100.0000 mg | ORAL_TABLET | Freq: Two times a day (BID) | ORAL | Status: DC

## 2013-11-06 NOTE — Assessment & Plan Note (Signed)
Trial of Levitra 20 mg/d prn

## 2013-11-06 NOTE — Assessment & Plan Note (Addendum)
We discussed age appropriate health related issues, including available/recomended screening tests and vaccinations. We discussed a need for adhering to healthy diet and exercise. Labs/EKG were reviewed/ordered. All questions were answered. EKG Colon vs cologuard discussed - cologuard

## 2013-11-06 NOTE — Progress Notes (Signed)
   Subjective:   HPI  The patient is here for a wellness exam. The patient has been doing well overall without major physical or psychological issues going on lately.   C/o R testes pain at times - he saw Dr Barnie Del and had an [place] - ok  BP Readings from Last 3 Encounters:  11/06/13 138/98  07/24/13 120/80  11/05/12 120/84   Wt Readings from Last 3 Encounters:  11/06/13 167 lb (75.751 kg)  07/24/13 172 lb 12.8 oz (78.382 kg)  11/05/12 169 lb (76.658 kg)       Review of Systems  Constitutional: Negative for appetite change, fatigue and unexpected weight change.  HENT: Negative for congestion, nosebleeds, sneezing, sore throat and trouble swallowing.   Eyes: Negative for itching and visual disturbance.  Respiratory: Negative for cough.   Cardiovascular: Negative for chest pain, palpitations and leg swelling.  Gastrointestinal: Negative for nausea, diarrhea, blood in stool and abdominal distention.  Genitourinary: Negative for frequency and hematuria.  Musculoskeletal: Negative for back pain, gait problem, joint swelling and neck pain.  Skin: Negative for rash.  Neurological: Negative for dizziness, tremors, speech difficulty and weakness.  Psychiatric/Behavioral: Negative for suicidal ideas, sleep disturbance, dysphoric mood and agitation. The patient is not nervous/anxious.        Objective:   Physical Exam  Constitutional: He is oriented to person, place, and time. He appears well-developed.  HENT:  Mouth/Throat: Oropharynx is clear and moist.  Eyes: Conjunctivae are normal. Pupils are equal, round, and reactive to light.  Neck: Normal range of motion. No JVD present. No thyromegaly present.  Cardiovascular: Normal rate, regular rhythm, normal heart sounds and intact distal pulses.  Exam reveals no gallop and no friction rub.   No murmur heard. Pulmonary/Chest: Effort normal and breath sounds normal. No respiratory distress. He has no wheezes. He has no rales. He exhibits  no tenderness.  Abdominal: Soft. Bowel sounds are normal. He exhibits no distension and no mass. There is no tenderness. There is no rebound and no guarding.  Genitourinary: Rectum normal, prostate normal and penis normal. Guaiac negative stool. No penile tenderness.  L testes is absent  Musculoskeletal: Normal range of motion. He exhibits no edema and no tenderness.  Lymphadenopathy:    He has no cervical adenopathy.  Neurological: He is alert and oriented to person, place, and time. He has normal reflexes. No cranial nerve deficit. He exhibits normal muscle tone. Coordination normal.  Skin: Skin is warm and dry. No rash noted.  Psychiatric: He has a normal mood and affect. His behavior is normal. Judgment and thought content normal.    Lab Results  Component Value Date   WBC 5.4 11/05/2012   HGB 13.7 11/05/2012   HCT 40.3 11/05/2012   PLT 144.0* 11/05/2012   CHOL 153 10/17/2011   TRIG 91.0 10/17/2011   HDL 28.50* 10/17/2011   ALT 33 11/05/2012   AST 24 11/05/2012   NA 142 11/05/2012   K 4.5 11/05/2012   CL 105 11/05/2012   CREATININE 0.9 11/05/2012   BUN 20 11/05/2012   CO2 29 11/05/2012   TSH 3.69 11/05/2012   PSA 0.70 10/17/2011   HGBA1C 6.3 10/07/2009         Assessment & Plan:

## 2013-11-06 NOTE — Assessment & Plan Note (Signed)
2015 Dr Barnie Del - has rec labs

## 2013-11-06 NOTE — Progress Notes (Signed)
Pre visit review using our clinic review tool, if applicable. No additional management support is needed unless otherwise documented below in the visit note. 

## 2013-11-07 LAB — AFP TUMOR MARKER: AFP TUMOR MARKER: 1.8 ng/mL (ref <6.1)

## 2013-11-19 ENCOUNTER — Ambulatory Visit (AMBULATORY_SURGERY_CENTER): Payer: Self-pay

## 2013-11-19 ENCOUNTER — Telehealth: Payer: Self-pay

## 2013-11-19 VITALS — Ht 70.0 in | Wt 166.8 lb

## 2013-11-19 DIAGNOSIS — Z1211 Encounter for screening for malignant neoplasm of colon: Secondary | ICD-10-CM

## 2013-11-19 MED ORDER — NA SULFATE-K SULFATE-MG SULF 17.5-3.13-1.6 GM/177ML PO SOLN: ORAL | Status: DC

## 2013-11-19 NOTE — Progress Notes (Signed)
Per pt, no allergies to soy or egg products.Pt not taking any weight loss meds or using  O2 at home. 

## 2013-11-19 NOTE — Telephone Encounter (Signed)
When was the date of his last colonoscopy and what were the results?

## 2013-11-19 NOTE — Telephone Encounter (Signed)
Colonoscopy report was put on Dr Kelby Fam desk for review and will be scanned into Epic later.

## 2013-11-19 NOTE — Telephone Encounter (Signed)
Dr Deatra Ina, This pt is here today for his pre-visit prior to his colonoscopy on 12/31/13.He received a letter that he was due for a colonoscopy now, but a recall letter states he is due in 2017. There is no family history of colon cancer, no polyps,no changes in bowel habits,and the pt is not having any problems. Pt states he did get a letter also from his insurance stating he did need a colonoscopy.The pt does have a history of testicular cancer in 2001.Please advise.Thanks

## 2013-11-22 NOTE — Telephone Encounter (Signed)
Phone call to patient to inform him that recall colon not due until 12/2015. Pt understands, denies problems at this time. Recall entered.

## 2013-11-22 NOTE — Telephone Encounter (Signed)
Inda Castle, MD Selina Cooley, RN            Schedule colo for 2017

## 2013-11-27 ENCOUNTER — Encounter: Payer: BC Managed Care – PPO | Admitting: Gastroenterology

## 2013-12-31 ENCOUNTER — Encounter: Payer: BC Managed Care – PPO | Admitting: Gastroenterology

## 2014-03-31 ENCOUNTER — Telehealth: Payer: Self-pay | Admitting: Internal Medicine

## 2014-03-31 MED ORDER — SERTRALINE HCL 100 MG PO TABS: 100.0000 mg | ORAL_TABLET | Freq: Two times a day (BID) | ORAL | Status: DC

## 2014-03-31 MED ORDER — BUPROPION HCL ER (SR) 150 MG PO TB12: 150.0000 mg | ORAL_TABLET | Freq: Two times a day (BID) | ORAL | Status: DC

## 2014-03-31 NOTE — Telephone Encounter (Signed)
Pt called in requesting refills on his    Bupropion 150mg   Sertraline 100mg     [facility] on [address]

## 2014-03-31 NOTE — Telephone Encounter (Signed)
Notified pt refills sent to [facility]...Stephen Newman

## 2014-04-23 ENCOUNTER — Other Ambulatory Visit: Payer: Self-pay | Admitting: Internal Medicine

## 2014-04-23 MED ORDER — SERTRALINE HCL 100 MG PO TABS: 100.0000 mg | ORAL_TABLET | Freq: Two times a day (BID) | ORAL | Status: DC

## 2014-04-23 MED ORDER — BUPROPION HCL ER (SR) 150 MG PO TB12: 150.0000 mg | ORAL_TABLET | Freq: Two times a day (BID) | ORAL | Status: DC

## 2014-04-23 MED ORDER — FLUTICASONE PROPIONATE 50 MCG/ACT NA SUSP: NASAL | Status: DC

## 2014-04-29 ENCOUNTER — Other Ambulatory Visit: Payer: Self-pay | Admitting: *Deleted

## 2014-04-29 ENCOUNTER — Encounter: Payer: Self-pay | Admitting: Internal Medicine

## 2014-04-29 MED ORDER — OMEPRAZOLE 20 MG PO CPDR
20.0000 mg | DELAYED_RELEASE_CAPSULE | Freq: Two times a day (BID) | ORAL | Status: DC
Start: 2014-04-29 — End: 2015-03-06

## 2014-04-29 MED ORDER — LOVASTATIN 20 MG PO TABS: 20.0000 mg | ORAL_TABLET | Freq: Two times a day (BID) | ORAL | Status: DC

## 2014-04-29 NOTE — Telephone Encounter (Signed)
Sent email requesting refills on his omeprazole & lovastatin to be sent to [facility]...Stephen Newman

## 2015-03-06 ENCOUNTER — Ambulatory Visit (INDEPENDENT_AMBULATORY_CARE_PROVIDER_SITE_OTHER): Payer: BLUE CROSS/BLUE SHIELD | Admitting: Internal Medicine

## 2015-03-06 ENCOUNTER — Encounter: Payer: Self-pay | Admitting: Internal Medicine

## 2015-03-06 VITALS — BP 112/78 | HR 73 | Temp 98.7°F | Ht 70.0 in | Wt 168.0 lb

## 2015-03-06 DIAGNOSIS — C6212 Malignant neoplasm of descended left testis: Secondary | ICD-10-CM | POA: Diagnosis not present

## 2015-03-06 DIAGNOSIS — F32A Depression, unspecified: Secondary | ICD-10-CM

## 2015-03-06 DIAGNOSIS — F329 Major depressive disorder, single episode, unspecified: Secondary | ICD-10-CM

## 2015-03-06 DIAGNOSIS — E785 Hyperlipidemia, unspecified: Secondary | ICD-10-CM | POA: Diagnosis not present

## 2015-03-06 DIAGNOSIS — K219 Gastro-esophageal reflux disease without esophagitis: Secondary | ICD-10-CM | POA: Diagnosis not present

## 2015-03-06 DIAGNOSIS — Z0000 Encounter for general adult medical examination without abnormal findings: Secondary | ICD-10-CM | POA: Diagnosis not present

## 2015-03-06 MED ORDER — FLUTICASONE PROPIONATE 50 MCG/ACT NA SUSP: NASAL | Status: DC

## 2015-03-06 MED ORDER — ROSUVASTATIN CALCIUM 10 MG PO TABS: 10.0000 mg | ORAL_TABLET | Freq: Every day | ORAL | Status: DC

## 2015-03-06 MED ORDER — BUPROPION HCL ER (SR) 150 MG PO TB12: 150.0000 mg | ORAL_TABLET | Freq: Two times a day (BID) | ORAL | Status: DC

## 2015-03-06 MED ORDER — OMEPRAZOLE 20 MG PO CPDR: 20.0000 mg | DELAYED_RELEASE_CAPSULE | Freq: Two times a day (BID) | ORAL | Status: DC

## 2015-03-06 MED ORDER — SERTRALINE HCL 100 MG PO TABS: 100.0000 mg | ORAL_TABLET | Freq: Two times a day (BID) | ORAL | Status: DC

## 2015-03-06 NOTE — Assessment & Plan Note (Signed)
Chronic  Omeprazole

## 2015-03-06 NOTE — Assessment & Plan Note (Signed)
HCG, AFP q 12 mo 

## 2015-03-06 NOTE — Progress Notes (Signed)
Subjective:  Patient ID: Stephen Newman, male    DOB: January 11, 1956  Age: 60 y.o. MRN: [id number]  CC: No chief complaint on file.   HPI Stephen Newman presents for a well exam. F/u seminoma, elevated lipids, GERD  Outpatient Prescriptions Prior to Visit  Medication Sig Dispense Refill  . aspirin 81 MG tablet Take 81 mg by mouth daily. Reported on 03/06/2015    . Cholecalciferol 1000 UNITS tablet Take 1,000 Units by mouth daily.      . Na Sulfate-K Sulfate-Mg Sulf (SUPREP BOWEL PREP) SOLN Suprep as directed / no substitutions 354 mL 0  . buPROPion (WELLBUTRIN SR) 150 MG 12 hr tablet Take 1 tablet (150 mg total) by mouth 2 (two) times daily. 60 tablet 10  . fluticasone (FLONASE) 50 MCG/ACT nasal spray USE 2 SPRAYS INTO THE NOSE 2 TIMES DAILY 16 g 10  . lovastatin (MEVACOR) 20 MG tablet Take 1 tablet (20 mg total) by mouth 2 (two) times daily. 60 tablet 11  . omeprazole (PRILOSEC) 20 MG capsule Take 1 capsule (20 mg total) by mouth 2 (two) times daily. 60 capsule 11  . sertraline (ZOLOFT) 100 MG tablet Take 1 tablet (100 mg total) by mouth 2 (two) times daily. 60 tablet 10  . acyclovir (ZOVIRAX) 200 MG capsule Take 2 capsules (400 mg total) by mouth 2 (two) times daily. Take for 10 d prn 120 capsule 1  . traMADol (ULTRAM) 50 MG tablet Take 1-2 tablets (50-100 mg total) by mouth 2 (two) times daily as needed for pain. (Patient not taking: Reported on 03/06/2015) 30 tablet 0   No facility-administered medications prior to visit.    ROS Review of Systems  Constitutional: Negative for appetite change, fatigue and unexpected weight change.  HENT: Negative for congestion, nosebleeds, sneezing, sore throat and trouble swallowing.   Eyes: Negative for itching and visual disturbance.  Respiratory: Negative for cough.   Cardiovascular: Negative for chest pain, palpitations and leg swelling.  Gastrointestinal: Negative for nausea, diarrhea, blood in stool and abdominal distention.    Genitourinary: Negative for frequency and hematuria.  Musculoskeletal: Negative for back pain, joint swelling, gait problem and neck pain.  Skin: Negative for rash.  Neurological: Negative for dizziness, tremors, speech difficulty and weakness.  Psychiatric/Behavioral: Negative for suicidal ideas, sleep disturbance, dysphoric mood and agitation. The patient is not nervous/anxious.     Objective:  BP 112/78 mmHg  Pulse 73  Temp(Src) 98.7 F (37.1 C) (Oral)  Ht 5\' 10"  (1.778 m)  Wt 168 lb (76.204 kg)  BMI 24.11 kg/m2  SpO2 98%  BP Readings from Last 3 Encounters:  03/06/15 112/78  11/06/13 120/80  07/24/13 120/80    Wt Readings from Last 3 Encounters:  03/06/15 168 lb (76.204 kg)  11/19/13 166 lb 12.8 oz (75.66 kg)  11/06/13 167 lb (75.751 kg)    Physical Exam  Constitutional: He is oriented to person, place, and time. He appears well-developed. No distress.  NAD  HENT:  Mouth/Throat: Oropharynx is clear and moist.  Eyes: Conjunctivae are normal. Pupils are equal, round, and reactive to light.  Neck: Normal range of motion. No JVD present. No thyromegaly present.  Cardiovascular: Normal rate, regular rhythm, normal heart sounds and intact distal pulses.  Exam reveals no gallop and no friction rub.   No murmur heard. Pulmonary/Chest: Effort normal and breath sounds normal. No respiratory distress. He has no wheezes. He has no rales. He exhibits no tenderness.  Abdominal: Soft. Bowel sounds are normal.  He exhibits no distension and no mass. There is no tenderness. There is no rebound and no guarding.  Musculoskeletal: Normal range of motion. He exhibits no edema or tenderness.  Lymphadenopathy:    He has no cervical adenopathy.  Neurological: He is alert and oriented to person, place, and time. He has normal reflexes. No cranial nerve deficit. He exhibits normal muscle tone. He displays a negative Romberg sign. Coordination and gait normal.  Skin: Skin is warm and dry. No  rash noted.  Psychiatric: He has a normal mood and affect. His behavior is normal. Judgment and thought content normal.    Lab Results  Component Value Date   WBC 5.0 11/06/2013   HGB 14.8 11/06/2013   HCT 45.1 11/06/2013   PLT 172.0 11/06/2013   GLUCOSE 117* 11/06/2013   CHOL 170 11/06/2013   TRIG 190.0* 11/06/2013   HDL 22.80* 11/06/2013   LDLCALC 109* 11/06/2013   ALT 45 11/06/2013   AST 32 11/06/2013   NA 138 11/06/2013   K 4.8 11/06/2013   CL 102 11/06/2013   CREATININE 1.0 11/06/2013   BUN 21 11/06/2013   CO2 31 11/06/2013   TSH 3.86 11/06/2013   PSA 1.23 11/06/2013   HGBA1C 6.3 10/07/2009    No results found.  Assessment & Plan:   Diagnoses and all orders for this visit:  Well adult exam -     Lipid panel; Future -     CBC with Differential/Platelet; Future -     Basic metabolic panel; Future -     Hepatic function panel; Future -     Urinalysis; Future -     TSH; Future -     PSA; Future -     Hepatitis C antibody; Future  Dyslipidemia  Seminoma of descended left testis (HCC)  Gastroesophageal reflux disease without esophagitis -     AFP tumor marker; Future -     B-HCG Quant; Future  Depression  Other orders -     buPROPion (WELLBUTRIN SR) 150 MG 12 hr tablet; Take 1 tablet (150 mg total) by mouth 2 (two) times daily. -     fluticasone (FLONASE) 50 MCG/ACT nasal spray; USE 2 SPRAYS INTO THE NOSE 2 TIMES DAILY -     Cancel: lovastatin (MEVACOR) 20 MG tablet; Take 1 tablet (20 mg total) by mouth 2 (two) times daily. -     omeprazole (PRILOSEC) 20 MG capsule; Take 1 capsule (20 mg total) by mouth 2 (two) times daily. -     sertraline (ZOLOFT) 100 MG tablet; Take 1 tablet (100 mg total) by mouth 2 (two) times daily. -     rosuvastatin (CRESTOR) 10 MG tablet; Take 1 tablet (10 mg total) by mouth daily.  I have discontinued Mr. Stephen Newman lovastatin. I am also having him start on rosuvastatin. Additionally, I am having him maintain his aspirin,  Cholecalciferol, acyclovir, traMADol, Na Sulfate-K Sulfate-Mg Sulf, buPROPion, fluticasone, omeprazole, and sertraline.  Meds ordered this encounter  Medications  . buPROPion (WELLBUTRIN SR) 150 MG 12 hr tablet    Sig: Take 1 tablet (150 mg total) by mouth 2 (two) times daily.    Dispense:  180 tablet    Refill:  3  . fluticasone (FLONASE) 50 MCG/ACT nasal spray    Sig: USE 2 SPRAYS INTO THE NOSE 2 TIMES DAILY    Dispense:  48 g    Refill:  3  . omeprazole (PRILOSEC) 20 MG capsule    Sig: Take 1 capsule (20  mg total) by mouth 2 (two) times daily.    Dispense:  180 capsule    Refill:  3  . sertraline (ZOLOFT) 100 MG tablet    Sig: Take 1 tablet (100 mg total) by mouth 2 (two) times daily.    Dispense:  180 tablet    Refill:  3  . rosuvastatin (CRESTOR) 10 MG tablet    Sig: Take 1 tablet (10 mg total) by mouth daily.    Dispense:  90 tablet    Refill:  3     Follow-up: Return in about 1 year (around 03/05/2016) for Wellness Exam.  Walker Kehr, MD

## 2015-03-06 NOTE — Assessment & Plan Note (Signed)
On Wellbutrin, Zoloft 

## 2015-03-06 NOTE — Assessment & Plan Note (Addendum)
We discussed age appropriate health related issues, including available/recomended screening tests and vaccinations. We discussed a need for adhering to healthy diet and exercise. Labs/EKG were reviewed/ordered. All questions were answered.  Colon 2016 ?[facility] -- due in 5 years

## 2015-03-06 NOTE — Assessment & Plan Note (Signed)
2/17 change to [facility] in 4 wks

## 2015-03-06 NOTE — Progress Notes (Signed)
Pre visit review using our clinic review tool, if applicable. No additional management support is needed unless otherwise documented below in the visit note. 

## 2015-04-30 ENCOUNTER — Ambulatory Visit (INDEPENDENT_AMBULATORY_CARE_PROVIDER_SITE_OTHER): Payer: BLUE CROSS/BLUE SHIELD | Admitting: Internal Medicine

## 2015-04-30 ENCOUNTER — Encounter: Payer: Self-pay | Admitting: Internal Medicine

## 2015-04-30 VITALS — BP 118/76 | HR 76 | Temp 100.7°F | Resp 20 | Wt 166.0 lb

## 2015-04-30 DIAGNOSIS — R062 Wheezing: Secondary | ICD-10-CM

## 2015-04-30 DIAGNOSIS — R05 Cough: Secondary | ICD-10-CM | POA: Diagnosis not present

## 2015-04-30 DIAGNOSIS — R7309 Other abnormal glucose: Secondary | ICD-10-CM | POA: Diagnosis not present

## 2015-04-30 DIAGNOSIS — R059 Cough, unspecified: Secondary | ICD-10-CM | POA: Insufficient documentation

## 2015-04-30 MED ORDER — PREDNISONE 10 MG PO TABS: ORAL_TABLET | ORAL | Status: DC

## 2015-04-30 MED ORDER — LEVOFLOXACIN 500 MG PO TABS: 500.0000 mg | ORAL_TABLET | Freq: Every day | ORAL | Status: DC

## 2015-04-30 MED ORDER — HYDROCODONE-HOMATROPINE 5-1.5 MG/5ML PO SYRP: 5.0000 mL | ORAL_SOLUTION | Freq: Four times a day (QID) | ORAL | Status: DC | PRN

## 2015-04-30 NOTE — Patient Instructions (Signed)
Please take all new medication as prescribed - the antibiotic, cough medicine, and prednisone ° °Please continue all other medications as before, and refills have been done if requested. ° °Please have the pharmacy call with any other refills you may need. ° °Please keep your appointments with your specialists as you may have planned ° ° ° °

## 2015-04-30 NOTE — Assessment & Plan Note (Addendum)
Asympt, stable overall by history and exam, recent data reviewed with pt, and pt to continue medical treatment as before,  to f/u any worsening symptoms or concerns Lab Results  Component Value Date   HGBA1C 6.3 10/07/2009   Pt to call for onset polys or cbg > 200 with steroid tx

## 2015-04-30 NOTE — Assessment & Plan Note (Signed)
/  Mild to mod, for predpac asd,  to f/u any worsening symptoms or concerns 

## 2015-04-30 NOTE — Assessment & Plan Note (Signed)
Mild to mod, c/w bronchitis vs pna, declines cxr, for antibx course, cough med prn,  to f/u any worsening symptoms or concerns 

## 2015-04-30 NOTE — Progress Notes (Signed)
Subjective:    Patient ID: Stephen Newman, male    DOB: 09-27-1955, 60 y.o.   MRN: [id number]  HPI  Here with acute onset mild to mod 2-3 days ST, HA, general weakness and malaise, with prod cough greenish sputum, but Pt denies chest pain, increased sob or doe, wheezing, orthopnea, PND, increased LE swelling, palpitations, dizziness or syncope, except for onset mild wheezing, sob since last PM.   Pt denies polydipsia, polyuria,.  Pt states overall good compliance with meds, trying to follow lower cholesterol, diabetic diet, wt overall stable  Past Medical History  Diagnosis Date  . Depression   . GERD (gastroesophageal reflux disease)   . Hyperlipidemia   . Cancer [facility]) 2001    testicular   Past Surgical History  Procedure Laterality Date  . Orchiectomy    . Colonoscopy    . Inguinal hernia repair      left side    reports that he quit smoking about 41 years ago. He has never used smokeless tobacco. He reports that he does not drink alcohol or use illicit drugs. family history includes Heart disease in his father and mother; Stroke in his father and mother. Allergies  Allergen Reactions  . Penicillins     Causes hives   Current Outpatient Prescriptions on File Prior to Visit  Medication Sig Dispense Refill  . aspirin 81 MG tablet Take 81 mg by mouth daily. Reported on 03/06/2015    . buPROPion (WELLBUTRIN SR) 150 MG 12 hr tablet Take 1 tablet (150 mg total) by mouth 2 (two) times daily. 180 tablet 3  . Cholecalciferol 1000 UNITS tablet Take 1,000 Units by mouth daily.      . fluticasone (FLONASE) 50 MCG/ACT nasal spray USE 2 SPRAYS INTO THE NOSE 2 TIMES DAILY 48 g 3  . Na Sulfate-K Sulfate-Mg Sulf (SUPREP BOWEL PREP) SOLN Suprep as directed / no substitutions 354 mL 0  . omeprazole (PRILOSEC) 20 MG capsule Take 1 capsule (20 mg total) by mouth 2 (two) times daily. 180 capsule 3  . rosuvastatin (CRESTOR) 10 MG tablet Take 1 tablet (10 mg total) by mouth daily. 90 tablet 3  .  sertraline (ZOLOFT) 100 MG tablet Take 1 tablet (100 mg total) by mouth 2 (two) times daily. 180 tablet 3  . traMADol (ULTRAM) 50 MG tablet Take 1-2 tablets (50-100 mg total) by mouth 2 (two) times daily as needed for pain. 30 tablet 0  . acyclovir (ZOVIRAX) 200 MG capsule Take 2 capsules (400 mg total) by mouth 2 (two) times daily. Take for 10 d prn 120 capsule 1   No current facility-administered medications on file prior to visit.   Review of Systems  Constitutional: Negative for unusual diaphoresis or night sweats HENT: Negative for ear swelling or discharge Eyes: Negative for worsening visual haziness  Respiratory: Negative for choking and stridor.   Gastrointestinal: Negative for distension or worsening eructation Genitourinary: Negative for retention or change in urine volume.  Musculoskeletal: Negative for other MSK pain or swelling Skin: Negative for color change and worsening wound Neurological: Negative for tremors and numbness other than noted  Psychiatric/Behavioral: Negative for decreased concentration or agitation other than above       Objective:   Physical Exam BP 118/76 mmHg  Pulse 76  Temp(Src) 100.7 F (38.2 C) (Oral)  Resp 20  Wt 166 lb (75.297 kg)  SpO2 95% VS noted, mild ill Constitutional: Pt appears in no apparent distress HENT: Head: NCAT.  Right Ear:  External ear normal.  Left Ear: External ear normal.  Eyes: . Pupils are equal, round, and reactive to light. Conjunctivae and EOM are normal Neck: Normal range of motion. Neck supple.  Cardiovascular: Normal rate and regular rhythm.   Pulmonary/Chest: Effort normal and breath sounds decreased without rales but with few bialt scattered wheezing.  Abd:  Soft, NT, ND, + BS Neurological: Pt is alert. Not confused , motor grossly intact Skin: Skin is warm. No rash, no LE edema Psychiatric: Pt behavior is normal. No agitation.   Influenza rapid - neg    Assessment & Plan:

## 2015-04-30 NOTE — Progress Notes (Signed)
Pre visit review using our clinic review tool, if applicable. No additional management support is needed unless otherwise documented below in the visit note. 

## 2015-05-26 ENCOUNTER — Other Ambulatory Visit: Payer: Self-pay | Admitting: Internal Medicine

## 2015-08-05 ENCOUNTER — Other Ambulatory Visit: Payer: Self-pay | Admitting: Internal Medicine

## 2015-10-10 DIAGNOSIS — Z23 Encounter for immunization: Secondary | ICD-10-CM | POA: Diagnosis not present

## 2015-11-03 ENCOUNTER — Encounter: Payer: Self-pay | Admitting: Internal Medicine

## 2015-12-10 ENCOUNTER — Other Ambulatory Visit (INDEPENDENT_AMBULATORY_CARE_PROVIDER_SITE_OTHER): Payer: BLUE CROSS/BLUE SHIELD

## 2015-12-10 DIAGNOSIS — Z0000 Encounter for general adult medical examination without abnormal findings: Secondary | ICD-10-CM

## 2015-12-10 DIAGNOSIS — K219 Gastro-esophageal reflux disease without esophagitis: Secondary | ICD-10-CM

## 2015-12-10 LAB — HEPATIC FUNCTION PANEL
ALBUMIN: 4.7 g/dL (ref 3.5–5.2)
ALT: 26 U/L (ref 0–53)
AST: 18 U/L (ref 0–37)
Alkaline Phosphatase: 65 U/L (ref 39–117)
Bilirubin, Direct: 0.1 mg/dL (ref 0.0–0.3)
TOTAL PROTEIN: 7 g/dL (ref 6.0–8.3)
Total Bilirubin: 0.6 mg/dL (ref 0.2–1.2)

## 2015-12-10 LAB — CBC WITH DIFFERENTIAL/PLATELET
BASOS PCT: 0.4 % (ref 0.0–3.0)
Basophils Absolute: 0 10*3/uL (ref 0.0–0.1)
EOS ABS: 0.1 10*3/uL (ref 0.0–0.7)
EOS PCT: 2.2 % (ref 0.0–5.0)
HEMATOCRIT: 44.1 % (ref 39.0–52.0)
HEMOGLOBIN: 14.7 g/dL (ref 13.0–17.0)
LYMPHS PCT: 21.6 % (ref 12.0–46.0)
Lymphs Abs: 1.1 10*3/uL (ref 0.7–4.0)
MCHC: 33.3 g/dL (ref 30.0–36.0)
MCV: 85 fl (ref 78.0–100.0)
Monocytes Absolute: 0.5 10*3/uL (ref 0.1–1.0)
Monocytes Relative: 9 % (ref 3.0–12.0)
Neutro Abs: 3.4 10*3/uL (ref 1.4–7.7)
Neutrophils Relative %: 66.8 % (ref 43.0–77.0)
Platelets: 161 10*3/uL (ref 150.0–400.0)
RBC: 5.19 Mil/uL (ref 4.22–5.81)
RDW: 13.9 % (ref 11.5–15.5)
WBC: 5.1 10*3/uL (ref 4.0–10.5)

## 2015-12-10 LAB — BASIC METABOLIC PANEL
BUN: 16 mg/dL (ref 6–23)
CO2: 32 meq/L (ref 19–32)
Calcium: 9.9 mg/dL (ref 8.4–10.5)
Chloride: 104 mEq/L (ref 96–112)
Creatinine, Ser: 0.92 mg/dL (ref 0.40–1.50)
GFR: 88.47 mL/min (ref >60.00)
GLUCOSE: 115 mg/dL — AB (ref 70–99)
POTASSIUM: 4.8 meq/L (ref 3.5–5.1)
Sodium: 143 mEq/L (ref 135–145)

## 2015-12-10 LAB — HCG, QUANTITATIVE, PREGNANCY: Quantitative HCG: 1.5 m[IU]/mL

## 2015-12-10 LAB — LIPID PANEL
CHOLESTEROL: 127 mg/dL (ref 0–200)
HDL: 30.4 mg/dL — ABNORMAL LOW (ref >39.00)
LDL Cholesterol: 67 mg/dL (ref 0–99)
NONHDL: 96.93
Total CHOL/HDL Ratio: 4
Triglycerides: 151 mg/dL — ABNORMAL HIGH (ref 0.0–149.0)
VLDL: 30.2 mg/dL (ref 0.0–40.0)

## 2015-12-10 LAB — URINALYSIS
BILIRUBIN URINE: NEGATIVE
HGB URINE DIPSTICK: NEGATIVE
KETONES UR: NEGATIVE
Leukocytes, UA: NEGATIVE
Nitrite: NEGATIVE
PH: 6 (ref 5.0–8.0)
Specific Gravity, Urine: 1.02 (ref 1.000–1.030)
TOTAL PROTEIN, URINE-UPE24: NEGATIVE
URINE GLUCOSE: NEGATIVE
Urobilinogen, UA: 0.2 (ref 0.0–1.0)

## 2015-12-10 LAB — PSA: PSA: 0.94 ng/mL (ref 0.10–4.00)

## 2015-12-10 LAB — TSH: TSH: 2.72 u[IU]/mL (ref 0.35–4.50)

## 2015-12-11 LAB — HEPATITIS C ANTIBODY: HCV Ab: NEGATIVE

## 2015-12-11 LAB — AFP TUMOR MARKER: AFP-Tumor Marker: 1.6 ng/mL (ref <6.1)

## 2016-01-06 ENCOUNTER — Encounter: Payer: Self-pay | Admitting: Internal Medicine

## 2016-01-16 ENCOUNTER — Other Ambulatory Visit: Payer: Self-pay | Admitting: Internal Medicine

## 2016-01-27 ENCOUNTER — Encounter: Payer: Self-pay | Admitting: Internal Medicine

## 2016-01-27 ENCOUNTER — Ambulatory Visit (INDEPENDENT_AMBULATORY_CARE_PROVIDER_SITE_OTHER): Payer: BLUE CROSS/BLUE SHIELD | Admitting: Internal Medicine

## 2016-01-27 DIAGNOSIS — R938 Abnormal findings on diagnostic imaging of other specified body structures: Secondary | ICD-10-CM

## 2016-01-27 DIAGNOSIS — R0989 Other specified symptoms and signs involving the circulatory and respiratory systems: Secondary | ICD-10-CM

## 2016-01-27 DIAGNOSIS — C6212 Malignant neoplasm of descended left testis: Secondary | ICD-10-CM | POA: Diagnosis not present

## 2016-01-27 DIAGNOSIS — R9389 Abnormal findings on diagnostic imaging of other specified body structures: Secondary | ICD-10-CM

## 2016-01-27 NOTE — Assessment & Plan Note (Signed)
CXR: "slight asymmetric  density  R chest wall>left CT ordered

## 2016-01-27 NOTE — Progress Notes (Signed)
Pre visit review using our clinic review tool, if applicable. No additional management support is needed unless otherwise documented below in the visit note. 

## 2016-01-27 NOTE — Progress Notes (Signed)
Subjective:  Patient ID: Stephen Newman, male    DOB: Jun 24, 1955  Age: 61 y.o. MRN: [id number]  CC: No chief complaint on file.   HPI LAJON STETZER presents for an abn CXR for work 01/06/16 - "slight asymmetric  density  R chest wall>left C/o swishing sound on the L ear  Outpatient Medications Prior to Visit  Medication Sig Dispense Refill  . aspirin 81 MG tablet Take 81 mg by mouth daily. Reported on 03/06/2015    . buPROPion (WELLBUTRIN SR) 150 MG 12 hr tablet Take 1 tablet (150 mg total) by mouth 2 (two) times daily. 180 tablet 3  . Cholecalciferol 1000 UNITS tablet Take 1,000 Units by mouth daily.      . fluticasone (FLONASE) 50 MCG/ACT nasal spray USE TWO SPRAY(S) IN EACH NOSTRIL TWICE DAILY 5 g 1  . HYDROcodone-homatropine (HYCODAN) 5-1.5 MG/5ML syrup Take 5 mLs by mouth every 6 (six) hours as needed for cough. 180 mL 0  . levofloxacin (LEVAQUIN) 500 MG tablet Take 1 tablet (500 mg total) by mouth daily. 10 tablet 0  . Na Sulfate-K Sulfate-Mg Sulf (SUPREP BOWEL PREP) SOLN Suprep as directed / no substitutions 354 mL 0  . omeprazole (PRILOSEC) 20 MG capsule TAKE ONE CAPSULE BY MOUTH TWICE DAILY 180 capsule 1  . predniSONE (DELTASONE) 10 MG tablet 2 tabs by mouth per day for 5 days 10 tablet 0  . rosuvastatin (CRESTOR) 10 MG tablet TAKE ONE TABLET BY MOUTH ONCE DAILY 90 tablet 1  . sertraline (ZOLOFT) 100 MG tablet Take 1 tablet (100 mg total) by mouth 2 (two) times daily. 180 tablet 3  . sertraline (ZOLOFT) 100 MG tablet TAKE ONE TABLET BY MOUTH TWICE DAILY 180 tablet 3  . sertraline (ZOLOFT) 100 MG tablet TAKE ONE TABLET BY MOUTH TWICE DAILY 180 tablet 2  . traMADol (ULTRAM) 50 MG tablet Take 1-2 tablets (50-100 mg total) by mouth 2 (two) times daily as needed for pain. 30 tablet 0  . acyclovir (ZOVIRAX) 200 MG capsule Take 2 capsules (400 mg total) by mouth 2 (two) times daily. Take for 10 d prn 120 capsule 1   No facility-administered medications prior to visit.      ROS Review of Systems  Constitutional: Negative for appetite change, fatigue and unexpected weight change.  HENT: Negative for congestion, nosebleeds, sneezing, sore throat and trouble swallowing.   Eyes: Negative for itching and visual disturbance.  Respiratory: Negative for cough.   Cardiovascular: Negative for chest pain, palpitations and leg swelling.  Gastrointestinal: Negative for abdominal distention, blood in stool, diarrhea and nausea.  Genitourinary: Negative for frequency and hematuria.  Musculoskeletal: Negative for back pain, gait problem, joint swelling and neck pain.  Skin: Negative for rash.  Neurological: Negative for dizziness, tremors, speech difficulty and weakness.  Psychiatric/Behavioral: Negative for agitation, dysphoric mood and sleep disturbance. The patient is not nervous/anxious.     Objective:  BP 138/80   Pulse 67   Wt 166 lb (75.3 kg)   SpO2 99%   BMI 23.82 kg/m   BP Readings from Last 3 Encounters:  01/27/16 138/80  04/30/15 118/76  03/06/15 112/78    Wt Readings from Last 3 Encounters:  01/27/16 166 lb (75.3 kg)  04/30/15 166 lb (75.3 kg)  03/06/15 168 lb (76.2 kg)    Physical Exam  Constitutional: He is oriented to person, place, and time. He appears well-developed. No distress.  NAD  HENT:  Mouth/Throat: Oropharynx is clear and moist.  Eyes:  Conjunctivae are normal. Pupils are equal, round, and reactive to light.  Neck: Normal range of motion. No JVD present. No thyromegaly present.  Cardiovascular: Normal rate, regular rhythm, normal heart sounds and intact distal pulses.  Exam reveals no gallop and no friction rub.   No murmur heard. Pulmonary/Chest: Effort normal and breath sounds normal. No respiratory distress. He has no wheezes. He has no rales. He exhibits no tenderness.  Abdominal: Soft. Bowel sounds are normal. He exhibits no distension and no mass. There is no tenderness. There is no rebound and no guarding.   Musculoskeletal: Normal range of motion. He exhibits no edema or tenderness.  Lymphadenopathy:    He has no cervical adenopathy.  Neurological: He is alert and oriented to person, place, and time. He has normal reflexes. No cranial nerve deficit. He exhibits normal muscle tone. He displays a negative Romberg sign. Coordination and gait normal.  Skin: Skin is warm and dry. No rash noted.  Psychiatric: He has a normal mood and affect. His behavior is normal. Judgment and thought content normal.  No masses on the chest  Lab Results  Component Value Date   WBC 5.1 12/10/2015   HGB 14.7 12/10/2015   HCT 44.1 12/10/2015   PLT 161.0 12/10/2015   GLUCOSE 115 (H) 12/10/2015   CHOL 127 12/10/2015   TRIG 151.0 (H) 12/10/2015   HDL 30.40 (L) 12/10/2015   LDLCALC 67 12/10/2015   ALT 26 12/10/2015   AST 18 12/10/2015   NA 143 12/10/2015   K 4.8 12/10/2015   CL 104 12/10/2015   CREATININE 0.92 12/10/2015   BUN 16 12/10/2015   CO2 32 12/10/2015   TSH 2.72 12/10/2015   PSA 0.94 12/10/2015   HGBA1C 6.3 10/07/2009    No results found.  Assessment & Plan:   There are no diagnoses linked to this encounter. I am having Mr. Meloni maintain his aspirin, Cholecalciferol, acyclovir, traMADol, Na Sulfate-K Sulfate-Mg Sulf, buPROPion, sertraline, levofloxacin, HYDROcodone-homatropine, predniSONE, sertraline, sertraline, rosuvastatin, fluticasone, and omeprazole.  No orders of the defined types were placed in this encounter.    Follow-up: No Follow-up on file.  Walker Kehr, MD

## 2016-01-27 NOTE — Assessment & Plan Note (Signed)
Chest CT 

## 2016-01-27 NOTE — Assessment & Plan Note (Signed)
Swishing sound x years 2018 ?[facility] ENT if needed Will order carot doppler B

## 2016-01-29 NOTE — Addendum Note (Signed)
Addended by: Cresenciano Lick on: 01/29/2016 02:01 PM   Modules accepted: Orders

## 2016-01-29 NOTE — Addendum Note (Signed)
Addended by: Cresenciano Lick on: 01/29/2016 04:46 PM   Modules accepted: Orders

## 2016-01-30 ENCOUNTER — Other Ambulatory Visit: Payer: Self-pay | Admitting: Internal Medicine

## 2016-02-02 ENCOUNTER — Ambulatory Visit (INDEPENDENT_AMBULATORY_CARE_PROVIDER_SITE_OTHER)
Admission: RE | Admit: 2016-02-02 | Discharge: 2016-02-02 | Disposition: A | Discharge status: Home / Self Care | Payer: BLUE CROSS/BLUE SHIELD | Source: Ambulatory Visit | Attending: Internal Medicine | Admitting: Internal Medicine

## 2016-02-02 DIAGNOSIS — R938 Abnormal findings on diagnostic imaging of other specified body structures: Secondary | ICD-10-CM

## 2016-02-02 DIAGNOSIS — C6212 Malignant neoplasm of descended left testis: Secondary | ICD-10-CM | POA: Diagnosis not present

## 2016-02-02 DIAGNOSIS — R0989 Other specified symptoms and signs involving the circulatory and respiratory systems: Secondary | ICD-10-CM | POA: Diagnosis not present

## 2016-02-02 MED ORDER — IOPAMIDOL (ISOVUE-300) INJECTION 61%
80.0000 mL | Freq: Once | INTRAVENOUS | Status: AC | PRN
Start: 2016-02-02 — End: 2016-02-02
  Administered 2016-02-02: 80 mL via INTRAVENOUS

## 2016-02-09 ENCOUNTER — Inpatient Hospital Stay (HOSPITAL_COMMUNITY): Admission: RE | Admit: 2016-02-09 | Payer: BLUE CROSS/BLUE SHIELD | Source: Ambulatory Visit

## 2016-02-12 ENCOUNTER — Encounter: Payer: Self-pay | Admitting: Internal Medicine

## 2016-03-09 ENCOUNTER — Encounter: Payer: BLUE CROSS/BLUE SHIELD | Admitting: Internal Medicine

## 2016-05-11 ENCOUNTER — Other Ambulatory Visit: Payer: Self-pay | Admitting: Internal Medicine

## 2016-08-17 ENCOUNTER — Encounter: Payer: Self-pay | Admitting: Internal Medicine

## 2016-08-17 ENCOUNTER — Ambulatory Visit (INDEPENDENT_AMBULATORY_CARE_PROVIDER_SITE_OTHER): Payer: BLUE CROSS/BLUE SHIELD | Admitting: Internal Medicine

## 2016-08-17 ENCOUNTER — Other Ambulatory Visit (INDEPENDENT_AMBULATORY_CARE_PROVIDER_SITE_OTHER): Payer: BLUE CROSS/BLUE SHIELD

## 2016-08-17 VITALS — BP 130/78 | HR 57 | Temp 98.4°F | Resp 12 | Ht 70.0 in | Wt 151.0 lb

## 2016-08-17 DIAGNOSIS — C6212 Malignant neoplasm of descended left testis: Secondary | ICD-10-CM

## 2016-08-17 DIAGNOSIS — F3342 Major depressive disorder, recurrent, in full remission: Secondary | ICD-10-CM | POA: Diagnosis not present

## 2016-08-17 DIAGNOSIS — Z0000 Encounter for general adult medical examination without abnormal findings: Secondary | ICD-10-CM | POA: Diagnosis not present

## 2016-08-17 DIAGNOSIS — R197 Diarrhea, unspecified: Secondary | ICD-10-CM

## 2016-08-17 DIAGNOSIS — E785 Hyperlipidemia, unspecified: Secondary | ICD-10-CM | POA: Diagnosis not present

## 2016-08-17 LAB — CBC WITH DIFFERENTIAL/PLATELET
BASOS ABS: 0 10*3/uL (ref 0.0–0.1)
Basophils Relative: 0.5 % (ref 0.0–3.0)
EOS ABS: 0.1 10*3/uL (ref 0.0–0.7)
Eosinophils Relative: 2 % (ref 0.0–5.0)
HCT: 43.8 % (ref 39.0–52.0)
Hemoglobin: 14.7 g/dL (ref 13.0–17.0)
LYMPHS ABS: 1 10*3/uL (ref 0.7–4.0)
Lymphocytes Relative: 19.8 % (ref 12.0–46.0)
MCHC: 33.6 g/dL (ref 30.0–36.0)
MCV: 87.1 fl (ref 78.0–100.0)
Monocytes Absolute: 0.5 10*3/uL (ref 0.1–1.0)
Monocytes Relative: 9.5 % (ref 3.0–12.0)
NEUTROS ABS: 3.4 10*3/uL (ref 1.4–7.7)
NEUTROS PCT: 68.2 % (ref 43.0–77.0)
PLATELETS: 142 10*3/uL — AB (ref 150.0–400.0)
RBC: 5.02 Mil/uL (ref 4.22–5.81)
RDW: 13.6 % (ref 11.5–15.5)
WBC: 5 10*3/uL (ref 4.0–10.5)

## 2016-08-17 LAB — LIPID PANEL
CHOLESTEROL: 124 mg/dL (ref 0–200)
HDL: 34.9 mg/dL — ABNORMAL LOW (ref >39.00)
LDL Cholesterol: 71 mg/dL (ref 0–99)
NONHDL: 88.66
TRIGLYCERIDES: 86 mg/dL (ref 0.0–149.0)
Total CHOL/HDL Ratio: 4
VLDL: 17.2 mg/dL (ref 0.0–40.0)

## 2016-08-17 LAB — URINALYSIS
BILIRUBIN URINE: NEGATIVE
HGB URINE DIPSTICK: NEGATIVE
KETONES UR: NEGATIVE
LEUKOCYTES UA: NEGATIVE
Nitrite: NEGATIVE
PH: 6 (ref 5.0–8.0)
Specific Gravity, Urine: 1.02 (ref 1.000–1.030)
TOTAL PROTEIN, URINE-UPE24: NEGATIVE
Urine Glucose: NEGATIVE
Urobilinogen, UA: 0.2 (ref 0.0–1.0)

## 2016-08-17 LAB — TSH: TSH: 2.92 u[IU]/mL (ref 0.35–4.50)

## 2016-08-17 LAB — COMPREHENSIVE METABOLIC PANEL
ALBUMIN: 4.8 g/dL (ref 3.5–5.2)
ALT: 26 U/L (ref 0–53)
AST: 20 U/L (ref 0–37)
Alkaline Phosphatase: 64 U/L (ref 39–117)
BUN: 20 mg/dL (ref 6–23)
CHLORIDE: 103 meq/L (ref 96–112)
CO2: 33 mEq/L — ABNORMAL HIGH (ref 19–32)
CREATININE: 0.98 mg/dL (ref 0.40–1.50)
Calcium: 9.7 mg/dL (ref 8.4–10.5)
GFR: 82.07 mL/min (ref >60.00)
GLUCOSE: 122 mg/dL — AB (ref 70–99)
Potassium: 4.6 mEq/L (ref 3.5–5.1)
SODIUM: 139 meq/L (ref 135–145)
TOTAL PROTEIN: 7.2 g/dL (ref 6.0–8.3)
Total Bilirubin: 0.6 mg/dL (ref 0.2–1.2)

## 2016-08-17 LAB — HCG, QUANTITATIVE, PREGNANCY: QUANTITATIVE HCG: 1.53 m[IU]/mL

## 2016-08-17 LAB — PSA: PSA: 1.72 ng/mL (ref 0.10–4.00)

## 2016-08-17 MED ORDER — ACYCLOVIR 200 MG PO CAPS: 400.0000 mg | ORAL_CAPSULE | Freq: Two times a day (BID) | ORAL | 1 refills | Status: DC

## 2016-08-17 MED ORDER — ROSUVASTATIN CALCIUM 10 MG PO TABS: 10.0000 mg | ORAL_TABLET | Freq: Every day | ORAL | 2 refills | Status: DC

## 2016-08-17 MED ORDER — OMEPRAZOLE 20 MG PO CPDR: 20.0000 mg | DELAYED_RELEASE_CAPSULE | Freq: Two times a day (BID) | ORAL | 2 refills | Status: DC

## 2016-08-17 MED ORDER — BUPROPION HCL ER (SR) 150 MG PO TB12: 150.0000 mg | ORAL_TABLET | Freq: Two times a day (BID) | ORAL | 2 refills | Status: DC

## 2016-08-17 MED ORDER — SERTRALINE HCL 100 MG PO TABS: 100.0000 mg | ORAL_TABLET | Freq: Two times a day (BID) | ORAL | 2 refills | Status: DC

## 2016-08-17 MED ORDER — TRAMADOL HCL 50 MG PO TABS: 50.0000 mg | ORAL_TABLET | Freq: Two times a day (BID) | ORAL | 1 refills | Status: DC | PRN

## 2016-08-17 MED ORDER — FLUTICASONE PROPIONATE 50 MCG/ACT NA SUSP: NASAL | 5 refills | Status: DC

## 2016-08-17 MED ORDER — ZOSTER VAC RECOMB ADJUVANTED 50 MCG/0.5ML IM SUSR: 0.5000 mL | Freq: Once | INTRAMUSCULAR | 1 refills | Status: AC

## 2016-08-17 NOTE — Progress Notes (Signed)
Subjective:  Patient ID: Stephen Newman, male    DOB: 29-May-1955  Age: 61 y.o. MRN: [id number]  CC: Annual Exam   HPI Stephen Newman presents for a well exam  Outpatient Medications Prior to Visit  Medication Sig Dispense Refill  . aspirin 81 MG tablet Take 81 mg by mouth daily. Reported on 03/06/2015    . buPROPion (WELLBUTRIN SR) 150 MG 12 hr tablet TAKE ONE TABLET BY MOUTH TWICE DAILY 180 tablet 3  . Cholecalciferol 1000 UNITS tablet Take 1,000 Units by mouth daily.      . fluticasone (FLONASE) 50 MCG/ACT nasal spray USE TWO SPRAY(S) IN EACH NOSTRIL TWICE DAILY 5 g 1  . fluticasone (FLONASE) 50 MCG/ACT nasal spray USE TWO SPRAY(S) IN EACH NOSTRIL TWICE DAILY 48 g 3  . fluticasone (FLONASE) 50 MCG/ACT nasal spray USE TWO SPRAY(S) IN EACH NOSTRIL TWICE DAILY 48 g 1  . Na Sulfate-K Sulfate-Mg Sulf (SUPREP BOWEL PREP) SOLN Suprep as directed / no substitutions 354 mL 0  . omeprazole (PRILOSEC) 20 MG capsule TAKE ONE CAPSULE BY MOUTH TWICE DAILY 180 capsule 1  . rosuvastatin (CRESTOR) 10 MG tablet TAKE ONE TABLET BY MOUTH ONCE DAILY 90 tablet 1  . sertraline (ZOLOFT) 100 MG tablet TAKE ONE TABLET BY MOUTH TWICE DAILY 180 tablet 2  . traMADol (ULTRAM) 50 MG tablet Take 1-2 tablets (50-100 mg total) by mouth 2 (two) times daily as needed for pain. 30 tablet 0  . acyclovir (ZOVIRAX) 200 MG capsule Take 2 capsules (400 mg total) by mouth 2 (two) times daily. Take for 10 d prn 120 capsule 1   No facility-administered medications prior to visit.     ROS Review of Systems  Constitutional: Negative for appetite change, fatigue and unexpected weight change.  HENT: Negative for congestion, nosebleeds, sneezing, sore throat and trouble swallowing.   Eyes: Negative for itching and visual disturbance.  Respiratory: Negative for cough.   Cardiovascular: Negative for chest pain, palpitations and leg swelling.  Gastrointestinal: Negative for abdominal distention, blood in stool, diarrhea  and nausea.  Genitourinary: Negative for frequency and hematuria.  Musculoskeletal: Negative for back pain, gait problem, joint swelling and neck pain.  Skin: Negative for rash.  Neurological: Negative for dizziness, tremors, speech difficulty and weakness.  Psychiatric/Behavioral: Negative for agitation, dysphoric mood and sleep disturbance. The patient is not nervous/anxious.     Objective:  BP 130/78 (BP Location: Left Arm, Patient Position: Sitting, Cuff Size: Normal)   Pulse (!) 57   Temp 98.4 F (36.9 C) (Oral)   Resp 12   Ht 5\' 10"  (1.778 m)   Wt 151 lb (68.5 kg)   SpO2 99%   BMI 21.67 kg/m   BP Readings from Last 3 Encounters:  08/17/16 130/78  01/27/16 138/80  04/30/15 118/76    Wt Readings from Last 3 Encounters:  08/17/16 151 lb (68.5 kg)  01/27/16 166 lb (75.3 kg)  04/30/15 166 lb (75.3 kg)    Physical Exam  Constitutional: He is oriented to person, place, and time. He appears well-developed and well-nourished. No distress.  HENT:  Head: Normocephalic and atraumatic.  Right Ear: External ear normal.  Left Ear: External ear normal.  Nose: Nose normal.  Mouth/Throat: Oropharynx is clear and moist. No oropharyngeal exudate.  Eyes: Pupils are equal, round, and reactive to light. Conjunctivae and EOM are normal. Right eye exhibits no discharge. Left eye exhibits no discharge. No scleral icterus.  Neck: Normal range of motion. Neck supple. No  JVD present. No tracheal deviation present. No thyromegaly present.  Cardiovascular: Normal rate, regular rhythm, normal heart sounds and intact distal pulses.  Exam reveals no gallop and no friction rub.   No murmur heard. Pulmonary/Chest: Effort normal and breath sounds normal. No stridor. No respiratory distress. He has no wheezes. He has no rales. He exhibits no tenderness.  Abdominal: Soft. Bowel sounds are normal. He exhibits no distension and no mass. There is no tenderness. There is no rebound and no guarding.    Genitourinary: Rectum normal, prostate normal and penis normal. Rectal exam shows guaiac negative stool. No penile tenderness.  Musculoskeletal: Normal range of motion. He exhibits no edema or tenderness.  Lymphadenopathy:    He has no cervical adenopathy.  Neurological: He is alert and oriented to person, place, and time. He has normal reflexes. No cranial nerve deficit. He exhibits normal muscle tone. Coordination normal.  Skin: Skin is warm and dry. No rash noted. He is not diaphoretic. No erythema. No pallor.  Psychiatric: He has a normal mood and affect. His behavior is normal. Judgment and thought content normal.    Lab Results  Component Value Date   WBC 5.1 12/10/2015   HGB 14.7 12/10/2015   HCT 44.1 12/10/2015   PLT 161.0 12/10/2015   GLUCOSE 115 (H) 12/10/2015   CHOL 127 12/10/2015   TRIG 151.0 (H) 12/10/2015   HDL 30.40 (L) 12/10/2015   LDLCALC 67 12/10/2015   ALT 26 12/10/2015   AST 18 12/10/2015   NA 143 12/10/2015   K 4.8 12/10/2015   CL 104 12/10/2015   CREATININE 0.92 12/10/2015   BUN 16 12/10/2015   CO2 32 12/10/2015   TSH 2.72 12/10/2015   PSA 0.94 12/10/2015   HGBA1C 6.3 10/07/2009    Ct Chest W Contrast  Result Date: 02/02/2016 CLINICAL DATA:  Slight asymmetry of the chest wall right more prominently than left, history of seminoma EXAM: CT CHEST WITH CONTRAST TECHNIQUE: Multidetector CT imaging of the chest was performed during intravenous contrast administration. CONTRAST:  64mL ISOVUE-300 IOPAMIDOL (ISOVUE-300) INJECTION 61% COMPARISON:  None. FINDINGS: Cardiovascular: The heart is within normal limits in size. No pericardial effusion is seen. The mid ascending thoracic aorta measures 30 mm in diameter. There is opacification of the pulmonary arteries as well with no central abnormality evident. Only mild of thoracic aortic atherosclerosis is present. Mediastinum/Nodes: No mediastinal or hilar adenopathy is seen. Only small precarinal mediastinal nodes are  present. The thyroid gland is unremarkable. Lungs/Pleura: On lung window images, no lung parenchymal abnormality is seen. There is no evidence of pneumonia and no pleural effusion is seen. No suspicious pulmonary nodule is evident. Upper Abdomen: There may be a few small gallstones layering within the gallbladder. Otherwise the upper abdomen included on the study is unremarkable. A probable small hepatic cyst is present in the left lobe. Musculoskeletal: The thoracic vertebrae are in normal alignment with normal intervertebral disc spaces. The only asymmetry detected is at the sternoclavicular joints where the right sternoclavicular joint is slightly more anterior than the left. No other explanation for the asymmetry described clinically is noted. IMPRESSION: 1. Some asymmetry in the sternoclavicular joints with the right sternoclavicular joint slightly more anterior. No other explanation for the described asymmetry is seen. 2. Mild thoracic aortic atherosclerosis. 3. Cannot exclude a few small gallstones layering within the gallbladder. Electronically Signed   By: Ivar Drape M.D.   On: 02/02/2016 16:48    Assessment & Plan:   There are no  diagnoses linked to this encounter. I am having Mr. Mazariego maintain his aspirin, Cholecalciferol, acyclovir, traMADol, Na Sulfate-K Sulfate-Mg Sulf, sertraline, fluticasone, omeprazole, fluticasone, buPROPion, fluticasone, and rosuvastatin.  No orders of the defined types were placed in this encounter.    Follow-up: No Follow-up on file.  Walker Kehr, MD

## 2016-08-17 NOTE — Assessment & Plan Note (Signed)
HCG, AFP q 12 mo 

## 2016-08-17 NOTE — Assessment & Plan Note (Signed)
Wellbutrin, Zoloft 

## 2016-08-17 NOTE — Assessment & Plan Note (Addendum)
We discussed age appropriate health related issues, including available/recomended screening tests and vaccinations. We discussed a need for adhering to healthy diet and exercise. Labs/EKG were reviewed/ordered. All questions were answered. Colon 2016 [place] -- due in 5 years 2021

## 2016-08-17 NOTE — Assessment & Plan Note (Signed)
-   Crestor 

## 2016-08-18 LAB — AFP TUMOR MARKER: AFP TUMOR MARKER: 1.4 ng/mL (ref <6.1)

## 2016-08-24 ENCOUNTER — Encounter: Payer: Self-pay | Admitting: Internal Medicine

## 2016-09-05 ENCOUNTER — Encounter: Payer: Self-pay | Admitting: Internal Medicine

## 2016-09-06 ENCOUNTER — Encounter: Payer: Self-pay | Admitting: Internal Medicine

## 2016-09-06 ENCOUNTER — Ambulatory Visit (INDEPENDENT_AMBULATORY_CARE_PROVIDER_SITE_OTHER): Payer: BLUE CROSS/BLUE SHIELD | Admitting: Internal Medicine

## 2016-09-06 VITALS — BP 126/84 | HR 60 | Temp 98.8°F | Ht 70.0 in | Wt 153.0 lb

## 2016-09-06 DIAGNOSIS — C6212 Malignant neoplasm of descended left testis: Secondary | ICD-10-CM

## 2016-09-06 DIAGNOSIS — B370 Candidal stomatitis: Secondary | ICD-10-CM | POA: Diagnosis not present

## 2016-09-06 DIAGNOSIS — R7309 Other abnormal glucose: Secondary | ICD-10-CM | POA: Diagnosis not present

## 2016-09-06 LAB — POCT GLYCOSYLATED HEMOGLOBIN (HGB A1C): HEMOGLOBIN A1C: 5.7

## 2016-09-06 MED ORDER — FLUCONAZOLE 100 MG PO TABS: ORAL_TABLET | ORAL | 1 refills | Status: AC

## 2016-09-06 NOTE — Patient Instructions (Addendum)
Use [employer] Peroxicare tooth paste     Hemoglobin A1c Test Some of the sugar (glucose) that circulates in your blood sticks or binds to blood proteins. Hemoglobin (Hb or Hgb) is one type of blood protein that glucose binds to. It also carries oxygen in the red blood cells (RBCs). When glucose binds to Hb, the glucose-coated Hb is called glycated Hb. Once Hb is glycated, it remains that way for the life of the RBC. This is about 120 days. Rather than testing your blood glucose level on one single day, the hemoglobin A1c (HbA1c) test measures the average amount of glycated hemoglobin and, therefore, the average amount of glucose in your blood during the 3-4 months just before the test is done. The HbA1c test is used to monitor long-term control of blood sugar in people who have diabetes mellitus. The HbA1c test can also be used in addition to or in combination with fasting blood glucose level and oral glucose tolerance tests. What do the results mean? It is your responsibility to obtain your test results. Ask the lab or department performing the test when and how you will get your results. Contact your health care provider to discuss any questions you have about your results. Range of Normal Values Ranges for normal values may vary among different labs and hospitals. You should always check with your health care provider after having lab work or other tests done to discuss the meaning of your test results and whether your values are considered within normal limits. The ranges for normal HbA1c test results are as follows:  Adult or child without diabetes: 4-5.9%.  Adult or child with diabetes and good blood glucose control: less than 6.5%.  Several factors can affect HbA1c test results. These may include:  Diseases (hemoglobinopathies) that cause a change in the shape, size, or amount of Hb in your blood.  Longer than normal RBC life span.  Abnormally low levels of certain proteins in your  blood.  Eating foods or taking supplements that are high in vitamin C (ascorbic acid).  Meaning of Results Outside Normal Value Ranges Abnormally high HbA1c values are most commonly an indication of prediabetes mellitus and diabetes mellitus:  An HbA1c result of 5.7-6.4% is considered diagnostic of prediabetes mellitus.  An HbA1c result of 6.5% or higher on two separate occasions is considered diagnostic of diabetes mellitus.  Abnormally low HbA1c values can be caused by several health conditions. These may include:  Pregnancy.  A large amount of blood loss.  Blood transfusions.  Low red blood cell count (anemia). This is caused by premature destruction of red blood cells.  Long-term kidney failure.  Some unusual forms of Hb (Hb variants), such as sickle cell trait.  Discuss your test results with your health care provider. He or she will use the results to make a diagnosis and determine a treatment plan that is right for you. Talk with your health care provider to discuss your results, treatment options, and if necessary, the need for more tests. Talk with your health care provider if you have any questions about your results. This information is not intended to replace advice given to you by your health care provider. Make sure you discuss any questions you have with your health care provider. Document Released: 02/02/2004 Document Revised: 10/07/2015 Document Reviewed: 05/27/2013 Elsevier Interactive Patient Education  2017 [employer].

## 2016-09-06 NOTE — Assessment & Plan Note (Signed)
Labs reviede

## 2016-09-06 NOTE — Assessment & Plan Note (Signed)
A1c

## 2016-09-06 NOTE — Progress Notes (Signed)
Subjective:  Patient ID: Stephen Newman, male    DOB: 16-Oct-1955  Age: 61 y.o. MRN: [id number]  CC: No chief complaint on file.   HPI Stephen Newman presents for a thrush in the mouth Pt had dental work and developed thrush   Outpatient Medications Prior to Visit  Medication Sig Dispense Refill  . acyclovir (ZOVIRAX) 200 MG capsule Take 2 capsules (400 mg total) by mouth 2 (two) times daily. Take for 10 d prn 120 capsule 1  . aspirin 81 MG tablet Take 81 mg by mouth daily. Reported on 03/06/2015    . buPROPion (WELLBUTRIN SR) 150 MG 12 hr tablet Take 1 tablet (150 mg total) by mouth 2 (two) times daily. 180 tablet 2  . Cholecalciferol 1000 UNITS tablet Take 1,000 Units by mouth daily.      . fluticasone (FLONASE) 50 MCG/ACT nasal spray USE TWO SPRAY(S) IN EACH NOSTRIL TWICE DAILY 48 g 3  . omeprazole (PRILOSEC) 20 MG capsule Take 1 capsule (20 mg total) by mouth 2 (two) times daily. 180 capsule 2  . rosuvastatin (CRESTOR) 10 MG tablet Take 1 tablet (10 mg total) by mouth daily. 90 tablet 2  . sertraline (ZOLOFT) 100 MG tablet Take 1 tablet (100 mg total) by mouth 2 (two) times daily. 180 tablet 2  . traMADol (ULTRAM) 50 MG tablet Take 1-2 tablets (50-100 mg total) by mouth 2 (two) times daily as needed. 30 tablet 1   No facility-administered medications prior to visit.     ROS Review of Systems  Constitutional: Negative for appetite change, fatigue and unexpected weight change.  HENT: Negative for congestion, nosebleeds, sneezing, sore throat and trouble swallowing.   Eyes: Negative for itching and visual disturbance.  Respiratory: Negative for cough.   Cardiovascular: Negative for chest pain, palpitations and leg swelling.  Gastrointestinal: Negative for abdominal distention, blood in stool, diarrhea and nausea.  Genitourinary: Negative for frequency and hematuria.  Musculoskeletal: Negative for back pain, gait problem, joint swelling and neck pain.  Skin: Negative for  rash.  Neurological: Negative for dizziness, tremors, speech difficulty and weakness.  Psychiatric/Behavioral: Negative for agitation, dysphoric mood and sleep disturbance. The patient is not nervous/anxious.     Objective:  BP 126/84 (BP Location: Left Arm, Patient Position: Sitting, Cuff Size: Normal)   Pulse 60   Temp 98.8 F (37.1 C) (Oral)   Ht 5\' 10"  (1.778 m)   Wt 153 lb (69.4 kg)   SpO2 100%   BMI 21.95 kg/m   BP Readings from Last 3 Encounters:  09/06/16 126/84  08/17/16 130/78  01/27/16 138/80    Wt Readings from Last 3 Encounters:  09/06/16 153 lb (69.4 kg)  08/17/16 151 lb (68.5 kg)  01/27/16 166 lb (75.3 kg)    Physical Exam  Constitutional: He is oriented to person, place, and time. He appears well-developed. No distress.  NAD  HENT:  Mouth/Throat: Oropharynx is clear and moist.  Eyes: Pupils are equal, round, and reactive to light. Conjunctivae are normal.  Neck: Normal range of motion. No JVD present. No thyromegaly present.  Cardiovascular: Normal rate, regular rhythm, normal heart sounds and intact distal pulses.  Exam reveals no gallop and no friction rub.   No murmur heard. Pulmonary/Chest: Effort normal and breath sounds normal. No respiratory distress. He has no wheezes. He has no rales. He exhibits no tenderness.  Abdominal: Soft. Bowel sounds are normal. He exhibits no distension and no mass. There is no tenderness. There is no  rebound and no guarding.  Musculoskeletal: Normal range of motion. He exhibits no edema or tenderness.  Lymphadenopathy:    He has no cervical adenopathy.  Neurological: He is alert and oriented to person, place, and time. He has normal reflexes. No cranial nerve deficit. He exhibits normal muscle tone. He displays a negative Romberg sign. Coordination and gait normal.  Skin: Skin is warm and dry. No rash noted.  Psychiatric: He has a normal mood and affect. His behavior is normal. Judgment and thought content normal.    Thrush  Lab Results  Component Value Date   WBC 5.0 08/17/2016   HGB 14.7 08/17/2016   HCT 43.8 08/17/2016   PLT 142.0 (L) 08/17/2016   GLUCOSE 122 (H) 08/17/2016   CHOL 124 08/17/2016   TRIG 86.0 08/17/2016   HDL 34.90 (L) 08/17/2016   LDLCALC 71 08/17/2016   ALT 26 08/17/2016   AST 20 08/17/2016   NA 139 08/17/2016   K 4.6 08/17/2016   CL 103 08/17/2016   CREATININE 0.98 08/17/2016   BUN 20 08/17/2016   CO2 33 (H) 08/17/2016   TSH 2.92 08/17/2016   PSA 1.72 08/17/2016   HGBA1C 6.3 10/07/2009    Ct Chest W Contrast  Result Date: 02/02/2016 CLINICAL DATA:  Slight asymmetry of the chest wall right more prominently than left, history of seminoma EXAM: CT CHEST WITH CONTRAST TECHNIQUE: Multidetector CT imaging of the chest was performed during intravenous contrast administration. CONTRAST:  35mL ISOVUE-300 IOPAMIDOL (ISOVUE-300) INJECTION 61% COMPARISON:  None. FINDINGS: Cardiovascular: The heart is within normal limits in size. No pericardial effusion is seen. The mid ascending thoracic aorta measures 30 mm in diameter. There is opacification of the pulmonary arteries as well with no central abnormality evident. Only mild of thoracic aortic atherosclerosis is present. Mediastinum/Nodes: No mediastinal or hilar adenopathy is seen. Only small precarinal mediastinal nodes are present. The thyroid gland is unremarkable. Lungs/Pleura: On lung window images, no lung parenchymal abnormality is seen. There is no evidence of pneumonia and no pleural effusion is seen. No suspicious pulmonary nodule is evident. Upper Abdomen: There may be a few small gallstones layering within the gallbladder. Otherwise the upper abdomen included on the study is unremarkable. A probable small hepatic cyst is present in the left lobe. Musculoskeletal: The thoracic vertebrae are in normal alignment with normal intervertebral disc spaces. The only asymmetry detected is at the sternoclavicular joints where the right  sternoclavicular joint is slightly more anterior than the left. No other explanation for the asymmetry described clinically is noted. IMPRESSION: 1. Some asymmetry in the sternoclavicular joints with the right sternoclavicular joint slightly more anterior. No other explanation for the described asymmetry is seen. 2. Mild thoracic aortic atherosclerosis. 3. Cannot exclude a few small gallstones layering within the gallbladder. Electronically Signed   By: Ivar Drape M.D.   On: 02/02/2016 16:48    Assessment & Plan:   There are no diagnoses linked to this encounter. I am having Mr. Rathe maintain his aspirin, Cholecalciferol, fluticasone, buPROPion, omeprazole, rosuvastatin, sertraline, traMADol, and acyclovir.  No orders of the defined types were placed in this encounter.    Follow-up: No Follow-up on file.  Walker Kehr, MD

## 2016-09-06 NOTE — Assessment & Plan Note (Addendum)
Use [employer] Peroxicare tooth paste Diflucan po A1c Pt denies HIV risk factors

## 2017-07-04 ENCOUNTER — Other Ambulatory Visit: Payer: Self-pay | Admitting: Internal Medicine

## 2017-07-05 MED ORDER — ROSUVASTATIN CALCIUM 10 MG PO TABS: 10.0000 mg | ORAL_TABLET | Freq: Every day | ORAL | 3 refills | Status: DC

## 2017-07-05 MED ORDER — FLUTICASONE PROPIONATE 50 MCG/ACT NA SUSP: NASAL | 5 refills | Status: DC

## 2017-07-05 NOTE — Addendum Note (Signed)
Addended by: Karren Cobble on: 07/05/2017 11:12 AM   Modules accepted: Orders

## 2017-07-31 DIAGNOSIS — H61001 Unspecified perichondritis of right external ear: Secondary | ICD-10-CM | POA: Diagnosis not present

## 2017-07-31 DIAGNOSIS — X32XXXS Exposure to sunlight, sequela: Secondary | ICD-10-CM | POA: Diagnosis not present

## 2017-07-31 DIAGNOSIS — L821 Other seborrheic keratosis: Secondary | ICD-10-CM | POA: Diagnosis not present

## 2017-07-31 DIAGNOSIS — L814 Other melanin hyperpigmentation: Secondary | ICD-10-CM | POA: Diagnosis not present

## 2017-08-15 ENCOUNTER — Other Ambulatory Visit (INDEPENDENT_AMBULATORY_CARE_PROVIDER_SITE_OTHER): Payer: BLUE CROSS/BLUE SHIELD

## 2017-08-15 ENCOUNTER — Encounter: Payer: Self-pay | Admitting: Internal Medicine

## 2017-08-15 ENCOUNTER — Ambulatory Visit (INDEPENDENT_AMBULATORY_CARE_PROVIDER_SITE_OTHER): Payer: BLUE CROSS/BLUE SHIELD | Admitting: Internal Medicine

## 2017-08-15 VITALS — BP 118/78 | HR 58 | Temp 98.0°F | Ht 70.0 in | Wt 156.0 lb

## 2017-08-15 DIAGNOSIS — C6212 Malignant neoplasm of descended left testis: Secondary | ICD-10-CM

## 2017-08-15 DIAGNOSIS — F3342 Major depressive disorder, recurrent, in full remission: Secondary | ICD-10-CM | POA: Diagnosis not present

## 2017-08-15 DIAGNOSIS — Z0000 Encounter for general adult medical examination without abnormal findings: Secondary | ICD-10-CM

## 2017-08-15 DIAGNOSIS — R7309 Other abnormal glucose: Secondary | ICD-10-CM

## 2017-08-15 DIAGNOSIS — E785 Hyperlipidemia, unspecified: Secondary | ICD-10-CM

## 2017-08-15 DIAGNOSIS — R739 Hyperglycemia, unspecified: Secondary | ICD-10-CM | POA: Diagnosis not present

## 2017-08-15 LAB — CBC WITH DIFFERENTIAL/PLATELET
Basophils Absolute: 0 10*3/uL (ref 0.0–0.1)
Basophils Relative: 0.6 % (ref 0.0–3.0)
EOS PCT: 1.7 % (ref 0.0–5.0)
Eosinophils Absolute: 0.1 10*3/uL (ref 0.0–0.7)
HCT: 43.4 % (ref 39.0–52.0)
Hemoglobin: 14.6 g/dL (ref 13.0–17.0)
LYMPHS ABS: 1.1 10*3/uL (ref 0.7–4.0)
Lymphocytes Relative: 21 % (ref 12.0–46.0)
MCHC: 33.7 g/dL (ref 30.0–36.0)
MCV: 86.6 fl (ref 78.0–100.0)
MONOS PCT: 9.7 % (ref 3.0–12.0)
Monocytes Absolute: 0.5 10*3/uL (ref 0.1–1.0)
NEUTROS ABS: 3.4 10*3/uL (ref 1.4–7.7)
NEUTROS PCT: 67 % (ref 43.0–77.0)
PLATELETS: 147 10*3/uL — AB (ref 150.0–400.0)
RBC: 5.01 Mil/uL (ref 4.22–5.81)
RDW: 13.5 % (ref 11.5–15.5)
WBC: 5 10*3/uL (ref 4.0–10.5)

## 2017-08-15 LAB — HEPATIC FUNCTION PANEL
ALT: 30 U/L (ref 0–53)
AST: 18 U/L (ref 0–37)
Albumin: 4.5 g/dL (ref 3.5–5.2)
Alkaline Phosphatase: 70 U/L (ref 39–117)
BILIRUBIN TOTAL: 0.5 mg/dL (ref 0.2–1.2)
Bilirubin, Direct: 0.1 mg/dL (ref 0.0–0.3)
Total Protein: 7 g/dL (ref 6.0–8.3)

## 2017-08-15 LAB — BASIC METABOLIC PANEL
BUN: 21 mg/dL (ref 6–23)
CHLORIDE: 103 meq/L (ref 96–112)
CO2: 32 mEq/L (ref 19–32)
Calcium: 9.3 mg/dL (ref 8.4–10.5)
Creatinine, Ser: 0.96 mg/dL (ref 0.40–1.50)
GFR: 83.78 mL/min (ref >60.00)
GLUCOSE: 115 mg/dL — AB (ref 70–99)
POTASSIUM: 4.5 meq/L (ref 3.5–5.1)
SODIUM: 141 meq/L (ref 135–145)

## 2017-08-15 LAB — HCG, QUANTITATIVE, PREGNANCY: QUANTITATIVE HCG: 1.53 m[IU]/mL

## 2017-08-15 LAB — LIPID PANEL
Cholesterol: 134 mg/dL (ref 0–200)
HDL: 33 mg/dL — AB (ref >39.00)
LDL Cholesterol: 72 mg/dL (ref 0–99)
NonHDL: 101.18
TRIGLYCERIDES: 145 mg/dL (ref 0.0–149.0)
Total CHOL/HDL Ratio: 4
VLDL: 29 mg/dL (ref 0.0–40.0)

## 2017-08-15 LAB — URINALYSIS
BILIRUBIN URINE: NEGATIVE
HGB URINE DIPSTICK: NEGATIVE
Ketones, ur: NEGATIVE
Leukocytes, UA: NEGATIVE
Nitrite: NEGATIVE
Specific Gravity, Urine: 1.025 (ref 1.000–1.030)
TOTAL PROTEIN, URINE-UPE24: NEGATIVE
URINE GLUCOSE: NEGATIVE
Urobilinogen, UA: 0.2 (ref 0.0–1.0)
pH: 6 (ref 5.0–8.0)

## 2017-08-15 LAB — PSA: PSA: 1.04 ng/mL (ref 0.10–4.00)

## 2017-08-15 LAB — HEMOGLOBIN A1C: Hgb A1c MFr Bld: 6 % (ref 4.6–6.5)

## 2017-08-15 LAB — TSH: TSH: 4.77 u[IU]/mL — ABNORMAL HIGH (ref 0.35–4.50)

## 2017-08-15 MED ORDER — ACYCLOVIR 200 MG PO CAPS: 400.0000 mg | ORAL_CAPSULE | Freq: Two times a day (BID) | ORAL | 1 refills | Status: DC

## 2017-08-15 MED ORDER — ROSUVASTATIN CALCIUM 10 MG PO TABS: 10.0000 mg | ORAL_TABLET | Freq: Every day | ORAL | 3 refills | Status: DC

## 2017-08-15 MED ORDER — BUPROPION HCL ER (SR) 150 MG PO TB12: 150.0000 mg | ORAL_TABLET | Freq: Two times a day (BID) | ORAL | 3 refills | Status: DC

## 2017-08-15 MED ORDER — SERTRALINE HCL 100 MG PO TABS: 100.0000 mg | ORAL_TABLET | Freq: Two times a day (BID) | ORAL | 3 refills | Status: DC

## 2017-08-15 MED ORDER — FLUTICASONE PROPIONATE 50 MCG/ACT NA SUSP: NASAL | 3 refills | Status: DC

## 2017-08-15 MED ORDER — OMEPRAZOLE 20 MG PO CPDR: 20.0000 mg | DELAYED_RELEASE_CAPSULE | Freq: Two times a day (BID) | ORAL | 3 refills | Status: DC

## 2017-08-15 NOTE — Assessment & Plan Note (Signed)
HCG, AFP q 12 mo

## 2017-08-15 NOTE — Patient Instructions (Signed)

## 2017-08-15 NOTE — Assessment & Plan Note (Signed)
A1c

## 2017-08-15 NOTE — Assessment & Plan Note (Signed)
-   Crestor 

## 2017-08-15 NOTE — Assessment & Plan Note (Signed)
On Wellbutrin, Zoloft

## 2017-08-15 NOTE — Progress Notes (Signed)
Subjective:  Patient ID: Stephen Newman, male    DOB: 12/31/1955  Age: 62 y.o. MRN: [id number]  CC: No chief complaint on file.   HPI Stephen Newman presents for a well exam  Outpatient Medications Prior to Visit  Medication Sig Dispense Refill  . acyclovir (ZOVIRAX) 200 MG capsule Take 2 capsules (400 mg total) by mouth 2 (two) times daily. Take for 10 d prn 120 capsule 1  . aspirin 81 MG tablet Take 81 mg by mouth daily. Reported on 03/06/2015    . buPROPion (WELLBUTRIN SR) 150 MG 12 hr tablet Take 1 tablet (150 mg total) by mouth 2 (two) times daily. 180 tablet 2  . Cholecalciferol 1000 UNITS tablet Take 1,000 Units by mouth daily.      . fluconazole (DIFLUCAN) 100 MG tablet Take 2 tabs on day#1, then 1 tab daily on Days #2-10 11 tablet 1  . fluticasone (FLONASE) 50 MCG/ACT nasal spray USE TWO SPRAY(S) IN EACH NOSTRIL TWICE DAILY 48 g 3  . fluticasone (FLONASE) 50 MCG/ACT nasal spray USE 2 SPRAY(S) IN EACH NOSTRIL TWICE DAILY 18 g 5  . omeprazole (PRILOSEC) 20 MG capsule Take 1 capsule (20 mg total) by mouth 2 (two) times daily. 180 capsule 2  . rosuvastatin (CRESTOR) 10 MG tablet Take 1 tablet (10 mg total) by mouth daily. 90 tablet 3  . sertraline (ZOLOFT) 100 MG tablet Take 1 tablet (100 mg total) by mouth 2 (two) times daily. 180 tablet 2  . traMADol (ULTRAM) 50 MG tablet Take 1-2 tablets (50-100 mg total) by mouth 2 (two) times daily as needed. 30 tablet 1   No facility-administered medications prior to visit.     ROS: Review of Systems  Constitutional: Negative for appetite change, fatigue and unexpected weight change.  HENT: Negative for congestion, nosebleeds, sneezing, sore throat and trouble swallowing.   Eyes: Negative for itching and visual disturbance.  Respiratory: Negative for cough.   Cardiovascular: Negative for chest pain, palpitations and leg swelling.  Gastrointestinal: Negative for abdominal distention, blood in stool, diarrhea and nausea.    Genitourinary: Negative for frequency and hematuria.  Musculoskeletal: Negative for back pain, gait problem, joint swelling and neck pain.  Skin: Negative for rash.  Neurological: Negative for dizziness, tremors, speech difficulty and weakness.  Psychiatric/Behavioral: Negative for agitation, dysphoric mood and sleep disturbance. The patient is not nervous/anxious.     Objective:  BP 118/78 (BP Location: Left Arm, Patient Position: Sitting, Cuff Size: Normal)   Pulse (!) 58   Temp 98 F (36.7 C) (Oral)   Ht 5\' 10"  (1.778 m)   Wt 156 lb (70.8 kg)   SpO2 98%   BMI 22.38 kg/m   BP Readings from Last 3 Encounters:  08/15/17 118/78  09/06/16 126/84  08/17/16 130/78    Wt Readings from Last 3 Encounters:  08/15/17 156 lb (70.8 kg)  09/06/16 153 lb (69.4 kg)  08/17/16 151 lb (68.5 kg)    Physical Exam  Constitutional: He is oriented to person, place, and time. He appears well-developed. No distress.  NAD  HENT:  Mouth/Throat: Oropharynx is clear and moist.  Eyes: Pupils are equal, round, and reactive to light. Conjunctivae are normal.  Neck: Normal range of motion. No JVD present. No thyromegaly present.  Cardiovascular: Normal rate, regular rhythm, normal heart sounds and intact distal pulses. Exam reveals no gallop and no friction rub.  No murmur heard. Pulmonary/Chest: Effort normal and breath sounds normal. No respiratory distress. He has  no wheezes. He has no rales. He exhibits no tenderness.  Abdominal: Soft. Bowel sounds are normal. He exhibits no distension and no mass. There is no tenderness. There is no rebound and no guarding.  Musculoskeletal: Normal range of motion. He exhibits no edema or tenderness.  Lymphadenopathy:    He has no cervical adenopathy.  Neurological: He is alert and oriented to person, place, and time. He has normal reflexes. No cranial nerve deficit. He exhibits normal muscle tone. He displays a negative Romberg sign. Coordination and gait normal.   Skin: Skin is warm and dry. No rash noted.  Psychiatric: He has a normal mood and affect. His behavior is normal. Judgment and thought content normal.    Lab Results  Component Value Date   WBC 5.0 08/17/2016   HGB 14.7 08/17/2016   HCT 43.8 08/17/2016   PLT 142.0 (L) 08/17/2016   GLUCOSE 122 (H) 08/17/2016   CHOL 124 08/17/2016   TRIG 86.0 08/17/2016   HDL 34.90 (L) 08/17/2016   LDLCALC 71 08/17/2016   ALT 26 08/17/2016   AST 20 08/17/2016   NA 139 08/17/2016   K 4.6 08/17/2016   CL 103 08/17/2016   CREATININE 0.98 08/17/2016   BUN 20 08/17/2016   CO2 33 (H) 08/17/2016   TSH 2.92 08/17/2016   PSA 1.72 08/17/2016   HGBA1C 5.7 09/06/2016    Ct Chest W Contrast  Result Date: 02/02/2016 CLINICAL DATA:  Slight asymmetry of the chest wall right more prominently than left, history of seminoma EXAM: CT CHEST WITH CONTRAST TECHNIQUE: Multidetector CT imaging of the chest was performed during intravenous contrast administration. CONTRAST:  62mL ISOVUE-300 IOPAMIDOL (ISOVUE-300) INJECTION 61% COMPARISON:  None. FINDINGS: Cardiovascular: The heart is within normal limits in size. No pericardial effusion is seen. The mid ascending thoracic aorta measures 30 mm in diameter. There is opacification of the pulmonary arteries as well with no central abnormality evident. Only mild of thoracic aortic atherosclerosis is present. Mediastinum/Nodes: No mediastinal or hilar adenopathy is seen. Only small precarinal mediastinal nodes are present. The thyroid gland is unremarkable. Lungs/Pleura: On lung window images, no lung parenchymal abnormality is seen. There is no evidence of pneumonia and no pleural effusion is seen. No suspicious pulmonary nodule is evident. Upper Abdomen: There may be a few small gallstones layering within the gallbladder. Otherwise the upper abdomen included on the study is unremarkable. A probable small hepatic cyst is present in the left lobe. Musculoskeletal: The thoracic  vertebrae are in normal alignment with normal intervertebral disc spaces. The only asymmetry detected is at the sternoclavicular joints where the right sternoclavicular joint is slightly more anterior than the left. No other explanation for the asymmetry described clinically is noted. IMPRESSION: 1. Some asymmetry in the sternoclavicular joints with the right sternoclavicular joint slightly more anterior. No other explanation for the described asymmetry is seen. 2. Mild thoracic aortic atherosclerosis. 3. Cannot exclude a few small gallstones layering within the gallbladder. Electronically Signed   By: Ivar Drape M.D.   On: 02/02/2016 16:48    Assessment & Plan:   There are no diagnoses linked to this encounter.   No orders of the defined types were placed in this encounter.    Follow-up: No follow-ups on file.  Walker Kehr, MD

## 2017-08-15 NOTE — Assessment & Plan Note (Addendum)
We discussed age appropriate health related issues, including available/recomended screening tests and vaccinations. We discussed a need for adhering to healthy diet and exercise. Labs/EKG were reviewed/ordered. All questions were answered. Colon 2016 - [facility] -- due in 5 years OK to d/c ASA

## 2017-08-16 ENCOUNTER — Other Ambulatory Visit: Payer: Self-pay | Admitting: Internal Medicine

## 2017-08-16 DIAGNOSIS — R7989 Other specified abnormal findings of blood chemistry: Secondary | ICD-10-CM

## 2017-08-16 LAB — AFP TUMOR MARKER: AFP-Tumor Marker: 1.4 ng/mL (ref <6.1)

## 2017-10-17 IMAGING — CT CT CHEST W/ CM
2 of 3 series · 15 of 36 positions shown, 18 images · IV contrast (ISOVUE 300)
Comparison: None.

CLINICAL DATA: Slight asymmetry of the chest wall right more
prominently than left, history of seminoma

EXAM:
CT CHEST WITH CONTRAST
TECHNIQUE: Multidetector CT imaging of the chest was performed during
intravenous contrast administration.
CONTRAST:  80mL XI22II-EBB IOPAMIDOL (XI22II-EBB) INJECTION 61%

[Series 2: thorax · axial · 0.73mm/px · z∈[-346,-50]mm · 12 of 174 slices shown, 15 images]
[im 13/174  mediastinal]
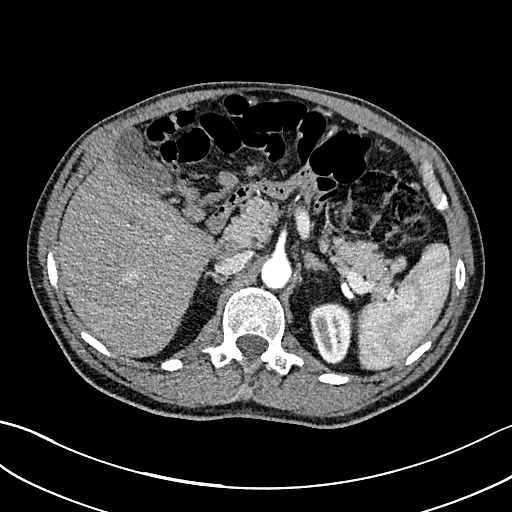
[im 13/174  lung]
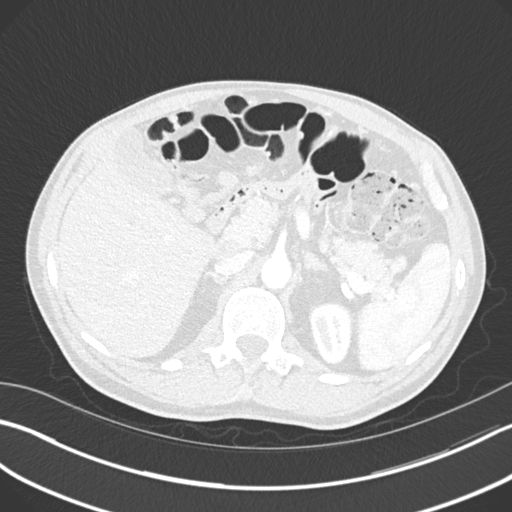
[im 26/174  lung]
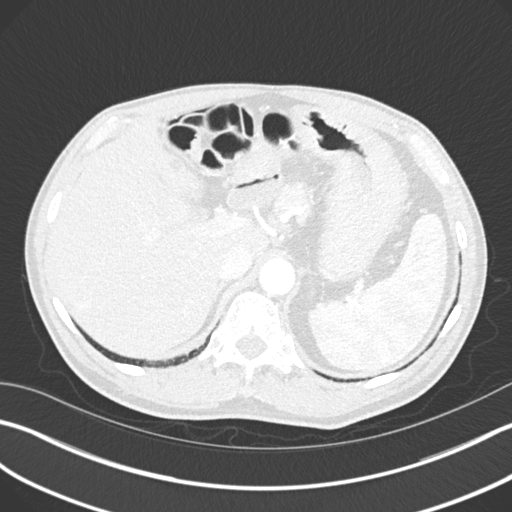
[im 39/174  lung]
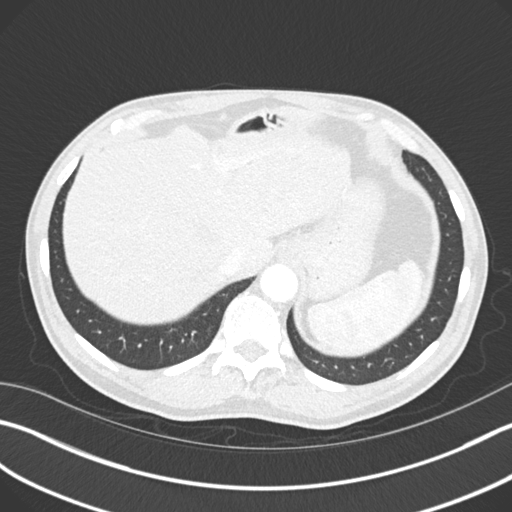
[im 52/174  lung]
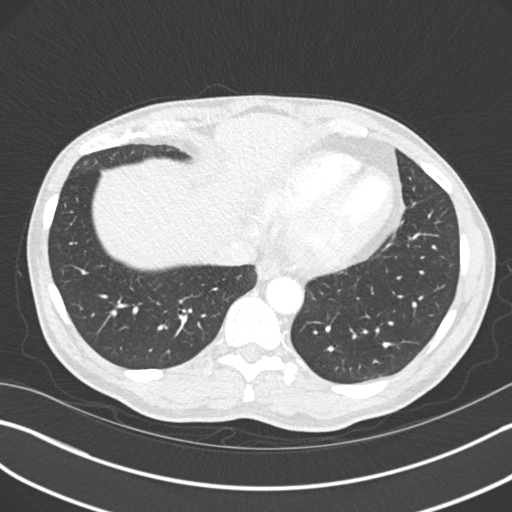
[im 65/174  mediastinal]
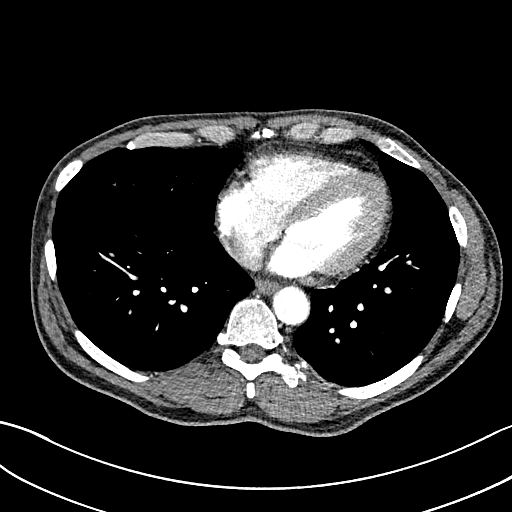
[im 65/174  lung]
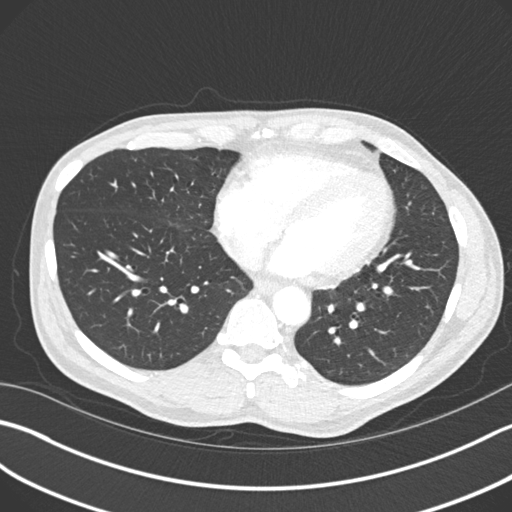
[im 77/174  lung]
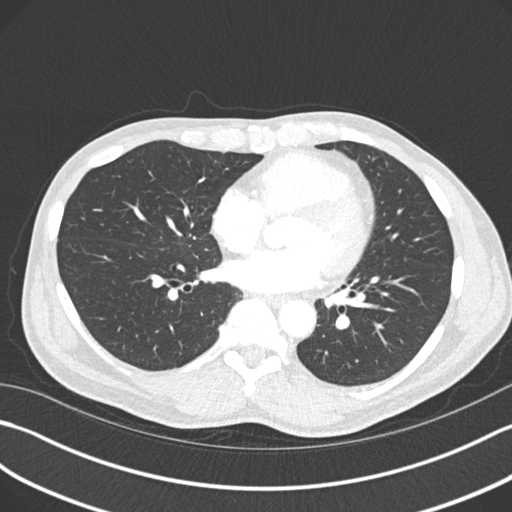
[im 97/174  lung]
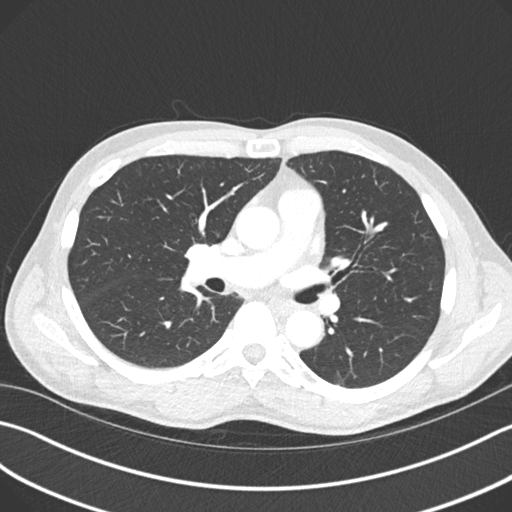
[im 109/174  lung]
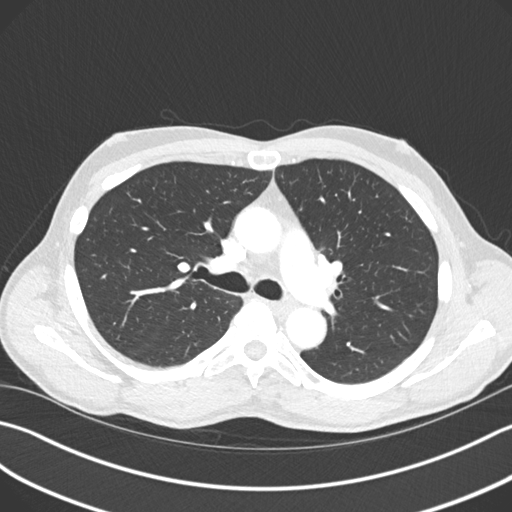
[im 122/174  mediastinal]
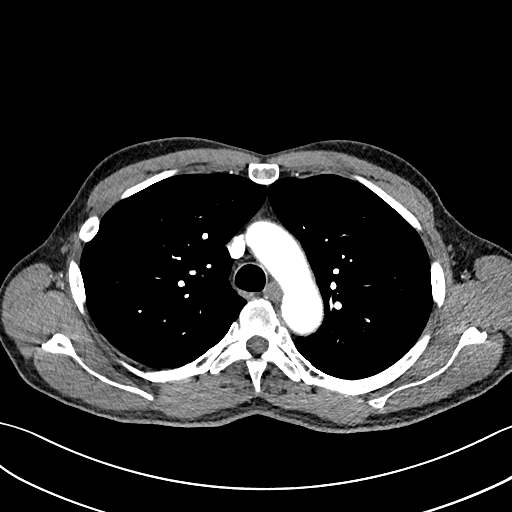
[im 122/174  lung]
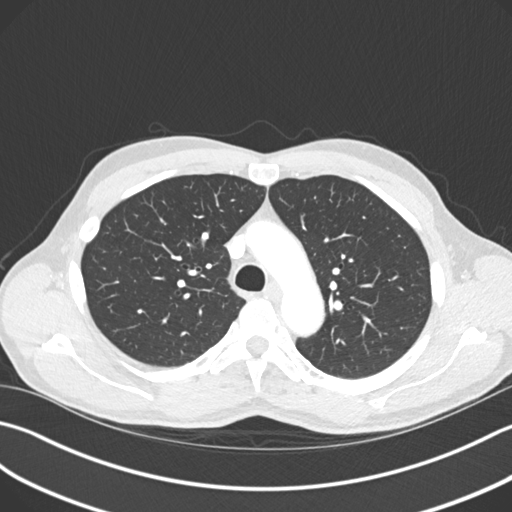
[im 135/174  lung]
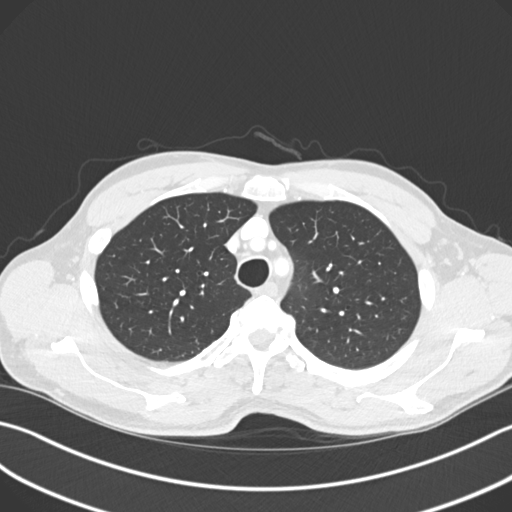
[im 148/174  lung]
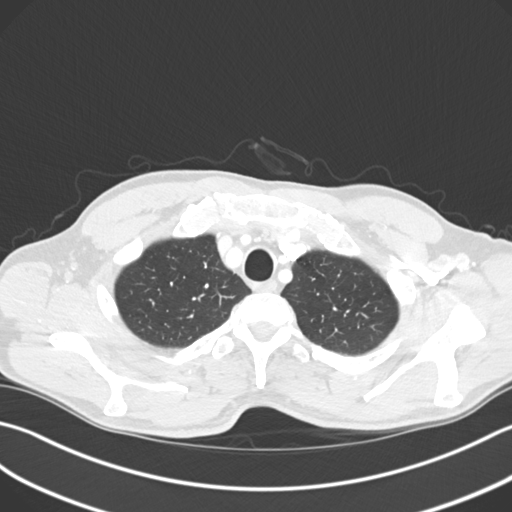
[im 161/174  lung]
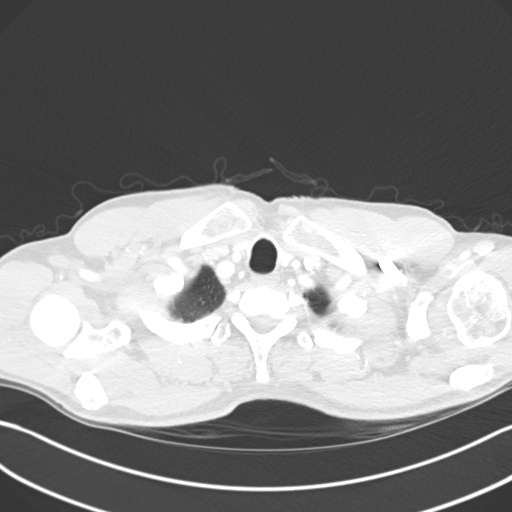

[Series 5: coronal · coronal · 0.68mm/px · 3 of 112 slices shown]
[im 23/112  lung]
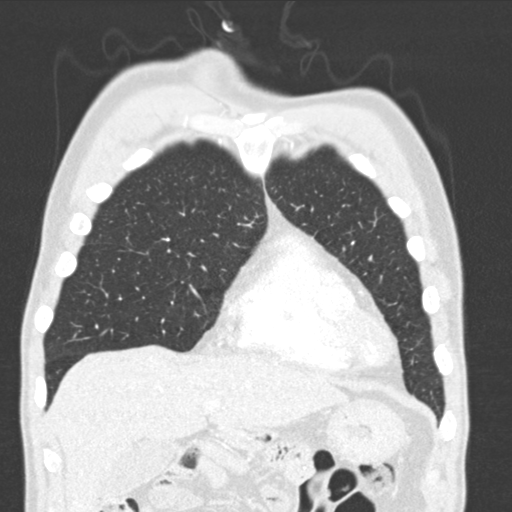
[im 45/112  lung]
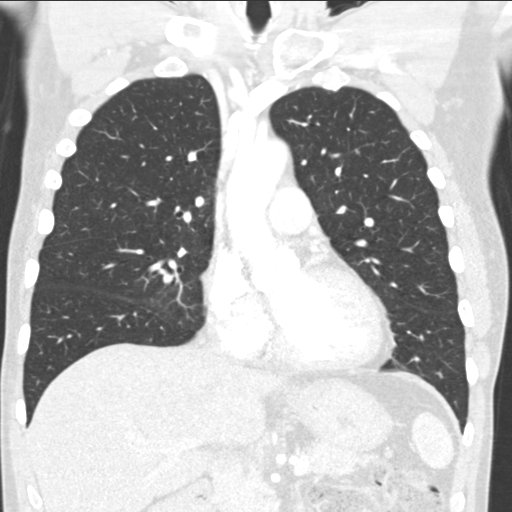
[im 67/112  lung]
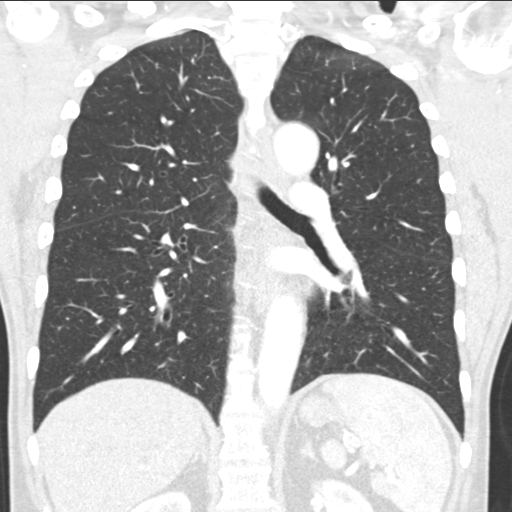

[15 of 36 positions shown; findings below may reference images not displayed]

FINDINGS: Cardiovascular: The heart is within normal limits in size. No
pericardial effusion is seen. The mid ascending thoracic aorta
measures 30 mm in diameter. There is opacification of the pulmonary
arteries as well with no central abnormality evident. Only mild of
thoracic aortic atherosclerosis is present.

Mediastinum/Nodes: No mediastinal or hilar adenopathy is seen. Only
small precarinal mediastinal nodes are present. The thyroid gland is
unremarkable.

Lungs/Pleura: On lung window images, no lung parenchymal abnormality
is seen. There is no evidence of pneumonia and no pleural effusion
is seen. No suspicious pulmonary nodule is evident.

Upper Abdomen: There may be a few small gallstones layering within
the gallbladder. Otherwise the upper abdomen included on the study
is unremarkable. A probable small hepatic cyst is present in the
left lobe.

Musculoskeletal: The thoracic vertebrae are in normal alignment with
normal intervertebral disc spaces. The only asymmetry detected is at
the sternoclavicular joints where the right sternoclavicular joint
is slightly more anterior than the left. No other explanation for
the asymmetry described clinically is noted.
IMPRESSION: 1. Some asymmetry in the sternoclavicular joints with the right
sternoclavicular joint slightly more anterior. No other explanation
for the described asymmetry is seen.
2. Mild thoracic aortic atherosclerosis.
3. Cannot exclude a few small gallstones layering within the
gallbladder.

## 2018-08-29 ENCOUNTER — Other Ambulatory Visit: Payer: Self-pay | Admitting: *Deleted

## 2018-08-29 MED ORDER — OMEPRAZOLE 20 MG PO CPDR: 20.0000 mg | DELAYED_RELEASE_CAPSULE | Freq: Two times a day (BID) | ORAL | 0 refills | Status: DC

## 2018-08-29 MED ORDER — FLUTICASONE PROPIONATE 50 MCG/ACT NA SUSP: NASAL | 0 refills | Status: DC

## 2018-08-29 MED ORDER — BUPROPION HCL ER (SR) 150 MG PO TB12: 150.0000 mg | ORAL_TABLET | Freq: Two times a day (BID) | ORAL | 0 refills | Status: DC

## 2018-08-29 MED ORDER — SERTRALINE HCL 100 MG PO TABS: 100.0000 mg | ORAL_TABLET | Freq: Two times a day (BID) | ORAL | 0 refills | Status: DC

## 2018-08-29 MED ORDER — ROSUVASTATIN CALCIUM 10 MG PO TABS: 10.0000 mg | ORAL_TABLET | Freq: Every day | ORAL | 0 refills | Status: DC

## 2018-10-01 ENCOUNTER — Other Ambulatory Visit: Payer: Self-pay | Admitting: Internal Medicine

## 2018-10-02 ENCOUNTER — Other Ambulatory Visit: Payer: Self-pay

## 2018-10-07 ENCOUNTER — Other Ambulatory Visit: Payer: Self-pay | Admitting: Internal Medicine

## 2018-10-07 MED ORDER — BUPROPION HCL ER (SR) 150 MG PO TB12: 150.0000 mg | ORAL_TABLET | Freq: Two times a day (BID) | ORAL | 0 refills | Status: DC

## 2018-10-07 MED ORDER — SERTRALINE HCL 100 MG PO TABS: 100.0000 mg | ORAL_TABLET | Freq: Two times a day (BID) | ORAL | 0 refills | Status: DC

## 2018-10-07 MED ORDER — OMEPRAZOLE 20 MG PO CPDR: 20.0000 mg | DELAYED_RELEASE_CAPSULE | Freq: Two times a day (BID) | ORAL | 0 refills | Status: DC

## 2018-10-07 MED ORDER — ROSUVASTATIN CALCIUM 10 MG PO TABS: 10.0000 mg | ORAL_TABLET | Freq: Every day | ORAL | 0 refills | Status: DC

## 2018-10-07 MED ORDER — ACYCLOVIR 200 MG PO CAPS: 400.0000 mg | ORAL_CAPSULE | Freq: Two times a day (BID) | ORAL | 1 refills | Status: AC

## 2018-10-07 MED ORDER — FLUTICASONE PROPIONATE 50 MCG/ACT NA SUSP: NASAL | 0 refills | Status: DC

## 2018-10-10 ENCOUNTER — Encounter: Payer: Self-pay | Admitting: Internal Medicine

## 2018-10-17 ENCOUNTER — Other Ambulatory Visit: Payer: Self-pay | Admitting: Internal Medicine

## 2018-10-18 NOTE — Telephone Encounter (Signed)
rxs sent to local, please send to mail order

## 2018-10-22 ENCOUNTER — Other Ambulatory Visit: Payer: Self-pay

## 2018-10-29 NOTE — Telephone Encounter (Signed)
Pt sched CPE for 11/07/2018.  Please refill rx until then.

## 2018-10-29 NOTE — Telephone Encounter (Signed)
buPROPion (WELLBUTRIN SR) 150 MG 12 hr tablet   sertraline (ZOLOFT) 100 MG tablet  omeprazole (PRILOSEC) 20 MG capsule  rosuvastatin (CRESTOR) 10 MG tablet  fluticasone (FLONASE) 50 MCG/ACT nasal spray   Send to [facility]

## 2018-10-29 NOTE — Addendum Note (Signed)
Addended by: Jefferson Fuel on: 10/29/2018 10:46 AM   Modules accepted: Orders

## 2018-10-29 NOTE — Telephone Encounter (Signed)
Patient advise that he needs to call office and schedule appointment.

## 2018-10-30 NOTE — Telephone Encounter (Signed)
Please send to mail order

## 2018-10-30 NOTE — Addendum Note (Signed)
Addended by: Karren Cobble on: 10/30/2018 11:02 AM   Modules accepted: Orders

## 2018-11-02 MED ORDER — SERTRALINE HCL 100 MG PO TABS: 100.0000 mg | ORAL_TABLET | Freq: Two times a day (BID) | ORAL | 0 refills | Status: AC

## 2018-11-02 MED ORDER — ROSUVASTATIN CALCIUM 10 MG PO TABS: 10.0000 mg | ORAL_TABLET | Freq: Every day | ORAL | 0 refills | Status: AC

## 2018-11-02 MED ORDER — FLUTICASONE PROPIONATE 50 MCG/ACT NA SUSP: NASAL | 0 refills | Status: AC

## 2018-11-02 MED ORDER — OMEPRAZOLE 20 MG PO CPDR: 20.0000 mg | DELAYED_RELEASE_CAPSULE | Freq: Two times a day (BID) | ORAL | 0 refills | Status: AC

## 2018-11-02 MED ORDER — BUPROPION HCL ER (SR) 150 MG PO TB12: 150.0000 mg | ORAL_TABLET | Freq: Two times a day (BID) | ORAL | 0 refills | Status: AC

## 2018-11-07 ENCOUNTER — Encounter: Payer: BLUE CROSS/BLUE SHIELD | Admitting: Internal Medicine

## 2018-11-13 ENCOUNTER — Other Ambulatory Visit: Payer: Self-pay
